# Patient Record
Sex: Female | Born: 1965 | Race: White | Hispanic: No | Marital: Married | Smoking: Never smoker
Health system: Southern US, Community
[De-identification: ages and names within clinical notes are randomized; demographics above are authoritative.]

## PROBLEM LIST (undated history)

## (undated) DIAGNOSIS — E119 Type 2 diabetes mellitus without complications: Secondary | ICD-10-CM

## (undated) DIAGNOSIS — R7303 Prediabetes: Secondary | ICD-10-CM

## (undated) DIAGNOSIS — N6310 Unspecified lump in the right breast, unspecified quadrant: Secondary | ICD-10-CM

## (undated) DIAGNOSIS — F32A Depression, unspecified: Secondary | ICD-10-CM

## (undated) DIAGNOSIS — M722 Plantar fascial fibromatosis: Secondary | ICD-10-CM

## (undated) DIAGNOSIS — M7532 Calcific tendinitis of left shoulder: Secondary | ICD-10-CM

## (undated) DIAGNOSIS — F419 Anxiety disorder, unspecified: Secondary | ICD-10-CM

## (undated) DIAGNOSIS — E785 Hyperlipidemia, unspecified: Secondary | ICD-10-CM

## (undated) DIAGNOSIS — K219 Gastro-esophageal reflux disease without esophagitis: Secondary | ICD-10-CM

## (undated) DIAGNOSIS — K589 Irritable bowel syndrome without diarrhea: Secondary | ICD-10-CM

## (undated) DIAGNOSIS — M7552 Bursitis of left shoulder: Secondary | ICD-10-CM

## (undated) DIAGNOSIS — N63 Unspecified lump in unspecified breast: Secondary | ICD-10-CM

## (undated) DIAGNOSIS — E559 Vitamin D deficiency, unspecified: Secondary | ICD-10-CM

## (undated) DIAGNOSIS — I10 Essential (primary) hypertension: Secondary | ICD-10-CM

## (undated) HISTORY — PX: TONSILLECTOMY: SUR1361

## (undated) HISTORY — DX: Anxiety disorder, unspecified: F41.9

## (undated) HISTORY — DX: Plantar fascial fibromatosis: M72.2

## (undated) HISTORY — DX: Hyperlipidemia, unspecified: E78.5

## (undated) HISTORY — DX: Essential (primary) hypertension: I10

## (undated) HISTORY — PX: LAPAROSCOPY FOR ECTOPIC PREGNANCY: SUR765

## (undated) HISTORY — DX: Unspecified lump in unspecified breast: N63.0

## (undated) HISTORY — PX: COLONOSCOPY: SHX174

## (undated) HISTORY — DX: Irritable bowel syndrome, unspecified: K58.9

## (undated) HISTORY — DX: Gastro-esophageal reflux disease without esophagitis: K21.9

## (undated) HISTORY — DX: Depression, unspecified: F32.A

## (undated) HISTORY — DX: Calcific tendinitis of left shoulder: M75.32

## (undated) HISTORY — DX: Vitamin D deficiency, unspecified: E55.9

## (undated) HISTORY — DX: Bursitis of left shoulder: M75.52

---

## 2015-06-03 DIAGNOSIS — M7751 Other enthesopathy of right foot: Secondary | ICD-10-CM | POA: Diagnosis not present

## 2015-06-03 DIAGNOSIS — M722 Plantar fascial fibromatosis: Secondary | ICD-10-CM | POA: Diagnosis not present

## 2015-07-30 DIAGNOSIS — L219 Seborrheic dermatitis, unspecified: Secondary | ICD-10-CM | POA: Diagnosis not present

## 2015-07-30 DIAGNOSIS — L820 Inflamed seborrheic keratosis: Secondary | ICD-10-CM | POA: Diagnosis not present

## 2015-07-30 DIAGNOSIS — L821 Other seborrheic keratosis: Secondary | ICD-10-CM | POA: Diagnosis not present

## 2015-07-30 DIAGNOSIS — D485 Neoplasm of uncertain behavior of skin: Secondary | ICD-10-CM | POA: Diagnosis not present

## 2015-10-23 DIAGNOSIS — E669 Obesity, unspecified: Secondary | ICD-10-CM | POA: Diagnosis not present

## 2015-10-23 DIAGNOSIS — I10 Essential (primary) hypertension: Secondary | ICD-10-CM | POA: Diagnosis not present

## 2015-10-23 DIAGNOSIS — E785 Hyperlipidemia, unspecified: Secondary | ICD-10-CM | POA: Diagnosis not present

## 2015-10-23 DIAGNOSIS — E559 Vitamin D deficiency, unspecified: Secondary | ICD-10-CM | POA: Diagnosis not present

## 2015-10-23 DIAGNOSIS — F329 Major depressive disorder, single episode, unspecified: Secondary | ICD-10-CM | POA: Diagnosis not present

## 2015-10-23 DIAGNOSIS — Z1389 Encounter for screening for other disorder: Secondary | ICD-10-CM | POA: Diagnosis not present

## 2015-10-23 DIAGNOSIS — R7301 Impaired fasting glucose: Secondary | ICD-10-CM | POA: Diagnosis not present

## 2015-10-24 DIAGNOSIS — N23 Unspecified renal colic: Secondary | ICD-10-CM | POA: Diagnosis not present

## 2015-10-24 DIAGNOSIS — K219 Gastro-esophageal reflux disease without esophagitis: Secondary | ICD-10-CM | POA: Diagnosis not present

## 2015-10-24 DIAGNOSIS — M549 Dorsalgia, unspecified: Secondary | ICD-10-CM | POA: Diagnosis not present

## 2015-10-24 DIAGNOSIS — E785 Hyperlipidemia, unspecified: Secondary | ICD-10-CM | POA: Diagnosis not present

## 2015-10-24 DIAGNOSIS — I10 Essential (primary) hypertension: Secondary | ICD-10-CM | POA: Diagnosis not present

## 2015-10-24 DIAGNOSIS — R110 Nausea: Secondary | ICD-10-CM | POA: Diagnosis not present

## 2015-10-24 DIAGNOSIS — Z79899 Other long term (current) drug therapy: Secondary | ICD-10-CM | POA: Diagnosis not present

## 2015-10-25 DIAGNOSIS — M549 Dorsalgia, unspecified: Secondary | ICD-10-CM | POA: Diagnosis not present

## 2015-11-12 DIAGNOSIS — Z23 Encounter for immunization: Secondary | ICD-10-CM | POA: Diagnosis not present

## 2015-11-20 DIAGNOSIS — Z1231 Encounter for screening mammogram for malignant neoplasm of breast: Secondary | ICD-10-CM | POA: Diagnosis not present

## 2015-11-24 DIAGNOSIS — H524 Presbyopia: Secondary | ICD-10-CM | POA: Diagnosis not present

## 2015-11-24 DIAGNOSIS — H35362 Drusen (degenerative) of macula, left eye: Secondary | ICD-10-CM | POA: Diagnosis not present

## 2015-12-11 DIAGNOSIS — H1031 Unspecified acute conjunctivitis, right eye: Secondary | ICD-10-CM | POA: Diagnosis not present

## 2016-01-21 DIAGNOSIS — M75102 Unspecified rotator cuff tear or rupture of left shoulder, not specified as traumatic: Secondary | ICD-10-CM | POA: Diagnosis not present

## 2016-01-21 DIAGNOSIS — Z6839 Body mass index (BMI) 39.0-39.9, adult: Secondary | ICD-10-CM | POA: Diagnosis not present

## 2016-01-23 DIAGNOSIS — M7532 Calcific tendinitis of left shoulder: Secondary | ICD-10-CM | POA: Diagnosis not present

## 2016-01-23 DIAGNOSIS — M25512 Pain in left shoulder: Secondary | ICD-10-CM | POA: Diagnosis not present

## 2016-02-10 DIAGNOSIS — M25512 Pain in left shoulder: Secondary | ICD-10-CM | POA: Diagnosis not present

## 2016-02-16 DIAGNOSIS — M7532 Calcific tendinitis of left shoulder: Secondary | ICD-10-CM | POA: Diagnosis not present

## 2016-02-16 DIAGNOSIS — M7552 Bursitis of left shoulder: Secondary | ICD-10-CM | POA: Diagnosis not present

## 2016-02-16 DIAGNOSIS — M25512 Pain in left shoulder: Secondary | ICD-10-CM | POA: Diagnosis not present

## 2016-02-21 DIAGNOSIS — J019 Acute sinusitis, unspecified: Secondary | ICD-10-CM | POA: Diagnosis not present

## 2016-03-05 DIAGNOSIS — M25512 Pain in left shoulder: Secondary | ICD-10-CM | POA: Diagnosis not present

## 2016-03-08 DIAGNOSIS — M25512 Pain in left shoulder: Secondary | ICD-10-CM | POA: Diagnosis not present

## 2016-03-10 DIAGNOSIS — M25512 Pain in left shoulder: Secondary | ICD-10-CM | POA: Diagnosis not present

## 2016-03-15 DIAGNOSIS — Z6839 Body mass index (BMI) 39.0-39.9, adult: Secondary | ICD-10-CM | POA: Diagnosis not present

## 2016-03-15 DIAGNOSIS — R112 Nausea with vomiting, unspecified: Secondary | ICD-10-CM | POA: Diagnosis not present

## 2016-03-15 DIAGNOSIS — R6889 Other general symptoms and signs: Secondary | ICD-10-CM | POA: Diagnosis not present

## 2016-03-15 DIAGNOSIS — J208 Acute bronchitis due to other specified organisms: Secondary | ICD-10-CM | POA: Diagnosis not present

## 2016-04-26 DIAGNOSIS — R7301 Impaired fasting glucose: Secondary | ICD-10-CM | POA: Diagnosis not present

## 2016-04-26 DIAGNOSIS — R05 Cough: Secondary | ICD-10-CM | POA: Diagnosis not present

## 2016-04-26 DIAGNOSIS — E785 Hyperlipidemia, unspecified: Secondary | ICD-10-CM | POA: Diagnosis not present

## 2016-04-26 DIAGNOSIS — F329 Major depressive disorder, single episode, unspecified: Secondary | ICD-10-CM | POA: Diagnosis not present

## 2016-04-26 DIAGNOSIS — I10 Essential (primary) hypertension: Secondary | ICD-10-CM | POA: Diagnosis not present

## 2016-04-26 DIAGNOSIS — R509 Fever, unspecified: Secondary | ICD-10-CM | POA: Diagnosis not present

## 2016-04-26 DIAGNOSIS — E559 Vitamin D deficiency, unspecified: Secondary | ICD-10-CM | POA: Diagnosis not present

## 2016-05-11 DIAGNOSIS — J189 Pneumonia, unspecified organism: Secondary | ICD-10-CM | POA: Diagnosis not present

## 2016-11-01 DIAGNOSIS — I10 Essential (primary) hypertension: Secondary | ICD-10-CM | POA: Diagnosis not present

## 2016-11-01 DIAGNOSIS — R635 Abnormal weight gain: Secondary | ICD-10-CM | POA: Diagnosis not present

## 2016-11-01 DIAGNOSIS — E785 Hyperlipidemia, unspecified: Secondary | ICD-10-CM | POA: Diagnosis not present

## 2016-11-01 DIAGNOSIS — Z1389 Encounter for screening for other disorder: Secondary | ICD-10-CM | POA: Diagnosis not present

## 2016-11-01 DIAGNOSIS — N951 Menopausal and female climacteric states: Secondary | ICD-10-CM | POA: Diagnosis not present

## 2016-11-01 DIAGNOSIS — E559 Vitamin D deficiency, unspecified: Secondary | ICD-10-CM | POA: Diagnosis not present

## 2016-11-01 DIAGNOSIS — R7301 Impaired fasting glucose: Secondary | ICD-10-CM | POA: Diagnosis not present

## 2016-11-01 DIAGNOSIS — Z6839 Body mass index (BMI) 39.0-39.9, adult: Secondary | ICD-10-CM | POA: Diagnosis not present

## 2016-11-01 DIAGNOSIS — F329 Major depressive disorder, single episode, unspecified: Secondary | ICD-10-CM | POA: Diagnosis not present

## 2016-11-10 DIAGNOSIS — Z23 Encounter for immunization: Secondary | ICD-10-CM | POA: Diagnosis not present

## 2016-12-06 DIAGNOSIS — N951 Menopausal and female climacteric states: Secondary | ICD-10-CM | POA: Diagnosis not present

## 2016-12-20 DIAGNOSIS — Z1231 Encounter for screening mammogram for malignant neoplasm of breast: Secondary | ICD-10-CM | POA: Diagnosis not present

## 2017-05-02 DIAGNOSIS — Z6836 Body mass index (BMI) 36.0-36.9, adult: Secondary | ICD-10-CM | POA: Diagnosis not present

## 2017-05-02 DIAGNOSIS — I10 Essential (primary) hypertension: Secondary | ICD-10-CM | POA: Diagnosis not present

## 2017-05-02 DIAGNOSIS — E559 Vitamin D deficiency, unspecified: Secondary | ICD-10-CM | POA: Diagnosis not present

## 2017-05-02 DIAGNOSIS — E785 Hyperlipidemia, unspecified: Secondary | ICD-10-CM | POA: Diagnosis not present

## 2017-05-02 DIAGNOSIS — R7301 Impaired fasting glucose: Secondary | ICD-10-CM | POA: Diagnosis not present

## 2017-05-02 DIAGNOSIS — F329 Major depressive disorder, single episode, unspecified: Secondary | ICD-10-CM | POA: Diagnosis not present

## 2017-05-02 DIAGNOSIS — R002 Palpitations: Secondary | ICD-10-CM | POA: Diagnosis not present

## 2017-05-04 DIAGNOSIS — H35363 Drusen (degenerative) of macula, bilateral: Secondary | ICD-10-CM | POA: Diagnosis not present

## 2017-05-04 DIAGNOSIS — H524 Presbyopia: Secondary | ICD-10-CM | POA: Diagnosis not present

## 2017-10-31 DIAGNOSIS — I10 Essential (primary) hypertension: Secondary | ICD-10-CM | POA: Diagnosis not present

## 2017-10-31 DIAGNOSIS — F329 Major depressive disorder, single episode, unspecified: Secondary | ICD-10-CM | POA: Diagnosis not present

## 2017-10-31 DIAGNOSIS — R7301 Impaired fasting glucose: Secondary | ICD-10-CM | POA: Diagnosis not present

## 2017-10-31 DIAGNOSIS — E559 Vitamin D deficiency, unspecified: Secondary | ICD-10-CM | POA: Diagnosis not present

## 2017-10-31 DIAGNOSIS — E785 Hyperlipidemia, unspecified: Secondary | ICD-10-CM | POA: Diagnosis not present

## 2018-01-23 DIAGNOSIS — Z1231 Encounter for screening mammogram for malignant neoplasm of breast: Secondary | ICD-10-CM | POA: Diagnosis not present

## 2018-01-30 DIAGNOSIS — R7301 Impaired fasting glucose: Secondary | ICD-10-CM | POA: Diagnosis not present

## 2018-01-30 DIAGNOSIS — E785 Hyperlipidemia, unspecified: Secondary | ICD-10-CM | POA: Diagnosis not present

## 2018-02-05 DIAGNOSIS — J069 Acute upper respiratory infection, unspecified: Secondary | ICD-10-CM | POA: Diagnosis not present

## 2018-02-08 HISTORY — PX: BREAST BIOPSY: SHX20

## 2018-02-27 ENCOUNTER — Other Ambulatory Visit: Payer: Self-pay | Admitting: Internal Medicine

## 2018-02-27 DIAGNOSIS — R928 Other abnormal and inconclusive findings on diagnostic imaging of breast: Secondary | ICD-10-CM | POA: Diagnosis not present

## 2018-02-27 DIAGNOSIS — N6342 Unspecified lump in left breast, subareolar: Secondary | ICD-10-CM | POA: Diagnosis not present

## 2018-02-27 DIAGNOSIS — N6320 Unspecified lump in the left breast, unspecified quadrant: Secondary | ICD-10-CM

## 2018-02-27 DIAGNOSIS — N6489 Other specified disorders of breast: Secondary | ICD-10-CM | POA: Diagnosis not present

## 2018-02-27 DIAGNOSIS — N6321 Unspecified lump in the left breast, upper outer quadrant: Secondary | ICD-10-CM | POA: Diagnosis not present

## 2018-03-06 ENCOUNTER — Ambulatory Visit
Admission: RE | Admit: 2018-03-06 | Discharge: 2018-03-06 | Disposition: A | Discharge status: Home / Self Care | Payer: BLUE CROSS/BLUE SHIELD | Source: Ambulatory Visit | Attending: Internal Medicine | Admitting: Internal Medicine

## 2018-03-06 DIAGNOSIS — N6320 Unspecified lump in the left breast, unspecified quadrant: Secondary | ICD-10-CM

## 2018-03-06 DIAGNOSIS — N6321 Unspecified lump in the left breast, upper outer quadrant: Secondary | ICD-10-CM | POA: Diagnosis not present

## 2018-03-06 DIAGNOSIS — D242 Benign neoplasm of left breast: Secondary | ICD-10-CM | POA: Diagnosis not present

## 2018-03-06 DIAGNOSIS — N6489 Other specified disorders of breast: Secondary | ICD-10-CM | POA: Diagnosis not present

## 2018-03-06 DIAGNOSIS — N6323 Unspecified lump in the left breast, lower outer quadrant: Secondary | ICD-10-CM | POA: Diagnosis not present

## 2018-05-01 DIAGNOSIS — E785 Hyperlipidemia, unspecified: Secondary | ICD-10-CM | POA: Diagnosis not present

## 2018-05-01 DIAGNOSIS — F329 Major depressive disorder, single episode, unspecified: Secondary | ICD-10-CM | POA: Diagnosis not present

## 2018-05-01 DIAGNOSIS — E559 Vitamin D deficiency, unspecified: Secondary | ICD-10-CM | POA: Diagnosis not present

## 2018-05-01 DIAGNOSIS — I10 Essential (primary) hypertension: Secondary | ICD-10-CM | POA: Diagnosis not present

## 2018-05-01 DIAGNOSIS — R7301 Impaired fasting glucose: Secondary | ICD-10-CM | POA: Diagnosis not present

## 2018-05-26 DIAGNOSIS — H6011 Cellulitis of right external ear: Secondary | ICD-10-CM | POA: Diagnosis not present

## 2018-07-03 DIAGNOSIS — S30861A Insect bite (nonvenomous) of abdominal wall, initial encounter: Secondary | ICD-10-CM | POA: Diagnosis not present

## 2018-07-17 DIAGNOSIS — A770 Spotted fever due to Rickettsia rickettsii: Secondary | ICD-10-CM | POA: Diagnosis not present

## 2018-08-01 DIAGNOSIS — Z79890 Hormone replacement therapy (postmenopausal): Secondary | ICD-10-CM | POA: Diagnosis not present

## 2018-08-01 DIAGNOSIS — Z01419 Encounter for gynecological examination (general) (routine) without abnormal findings: Secondary | ICD-10-CM | POA: Diagnosis not present

## 2018-08-01 DIAGNOSIS — H524 Presbyopia: Secondary | ICD-10-CM | POA: Diagnosis not present

## 2018-08-01 DIAGNOSIS — Z124 Encounter for screening for malignant neoplasm of cervix: Secondary | ICD-10-CM | POA: Diagnosis not present

## 2018-08-01 DIAGNOSIS — H353131 Nonexudative age-related macular degeneration, bilateral, early dry stage: Secondary | ICD-10-CM | POA: Diagnosis not present

## 2018-08-02 DIAGNOSIS — B354 Tinea corporis: Secondary | ICD-10-CM | POA: Diagnosis not present

## 2018-11-06 DIAGNOSIS — I10 Essential (primary) hypertension: Secondary | ICD-10-CM | POA: Diagnosis not present

## 2018-11-06 DIAGNOSIS — L4050 Arthropathic psoriasis, unspecified: Secondary | ICD-10-CM | POA: Diagnosis not present

## 2018-11-06 DIAGNOSIS — F329 Major depressive disorder, single episode, unspecified: Secondary | ICD-10-CM | POA: Diagnosis not present

## 2018-11-06 DIAGNOSIS — R7301 Impaired fasting glucose: Secondary | ICD-10-CM | POA: Diagnosis not present

## 2018-11-06 DIAGNOSIS — Z23 Encounter for immunization: Secondary | ICD-10-CM | POA: Diagnosis not present

## 2018-11-06 DIAGNOSIS — E559 Vitamin D deficiency, unspecified: Secondary | ICD-10-CM | POA: Diagnosis not present

## 2018-11-06 DIAGNOSIS — E785 Hyperlipidemia, unspecified: Secondary | ICD-10-CM | POA: Diagnosis not present

## 2018-11-07 ENCOUNTER — Other Ambulatory Visit: Payer: Self-pay | Admitting: Internal Medicine

## 2018-11-07 DIAGNOSIS — Z1231 Encounter for screening mammogram for malignant neoplasm of breast: Secondary | ICD-10-CM

## 2018-12-26 ENCOUNTER — Ambulatory Visit
Admission: RE | Admit: 2018-12-26 | Discharge: 2018-12-26 | Disposition: A | Discharge status: Home / Self Care | Payer: BC Managed Care – PPO | Source: Ambulatory Visit | Attending: Internal Medicine | Admitting: Internal Medicine

## 2018-12-26 ENCOUNTER — Other Ambulatory Visit: Payer: Self-pay

## 2018-12-26 DIAGNOSIS — Z1231 Encounter for screening mammogram for malignant neoplasm of breast: Secondary | ICD-10-CM

## 2019-03-26 DIAGNOSIS — M79601 Pain in right arm: Secondary | ICD-10-CM | POA: Diagnosis not present

## 2019-05-07 DIAGNOSIS — F329 Major depressive disorder, single episode, unspecified: Secondary | ICD-10-CM | POA: Diagnosis not present

## 2019-05-07 DIAGNOSIS — E785 Hyperlipidemia, unspecified: Secondary | ICD-10-CM | POA: Diagnosis not present

## 2019-05-07 DIAGNOSIS — R7301 Impaired fasting glucose: Secondary | ICD-10-CM | POA: Diagnosis not present

## 2019-05-07 DIAGNOSIS — E559 Vitamin D deficiency, unspecified: Secondary | ICD-10-CM | POA: Diagnosis not present

## 2019-05-07 DIAGNOSIS — I10 Essential (primary) hypertension: Secondary | ICD-10-CM | POA: Diagnosis not present

## 2019-05-16 DIAGNOSIS — M7531 Calcific tendinitis of right shoulder: Secondary | ICD-10-CM | POA: Diagnosis not present

## 2019-05-16 DIAGNOSIS — M25511 Pain in right shoulder: Secondary | ICD-10-CM | POA: Diagnosis not present

## 2019-05-31 DIAGNOSIS — M25611 Stiffness of right shoulder, not elsewhere classified: Secondary | ICD-10-CM | POA: Diagnosis not present

## 2019-05-31 DIAGNOSIS — M25511 Pain in right shoulder: Secondary | ICD-10-CM | POA: Diagnosis not present

## 2019-05-31 DIAGNOSIS — M6281 Muscle weakness (generalized): Secondary | ICD-10-CM | POA: Diagnosis not present

## 2019-06-06 DIAGNOSIS — M6281 Muscle weakness (generalized): Secondary | ICD-10-CM | POA: Diagnosis not present

## 2019-06-06 DIAGNOSIS — M25511 Pain in right shoulder: Secondary | ICD-10-CM | POA: Diagnosis not present

## 2019-06-06 DIAGNOSIS — M25611 Stiffness of right shoulder, not elsewhere classified: Secondary | ICD-10-CM | POA: Diagnosis not present

## 2019-06-14 DIAGNOSIS — M25611 Stiffness of right shoulder, not elsewhere classified: Secondary | ICD-10-CM | POA: Diagnosis not present

## 2019-06-14 DIAGNOSIS — M6281 Muscle weakness (generalized): Secondary | ICD-10-CM | POA: Diagnosis not present

## 2019-06-14 DIAGNOSIS — M25511 Pain in right shoulder: Secondary | ICD-10-CM | POA: Diagnosis not present

## 2019-06-27 DIAGNOSIS — M25511 Pain in right shoulder: Secondary | ICD-10-CM | POA: Diagnosis not present

## 2019-07-06 DIAGNOSIS — M6281 Muscle weakness (generalized): Secondary | ICD-10-CM | POA: Diagnosis not present

## 2019-07-06 DIAGNOSIS — M25611 Stiffness of right shoulder, not elsewhere classified: Secondary | ICD-10-CM | POA: Diagnosis not present

## 2019-07-06 DIAGNOSIS — M25511 Pain in right shoulder: Secondary | ICD-10-CM | POA: Diagnosis not present

## 2019-07-09 DIAGNOSIS — K5900 Constipation, unspecified: Secondary | ICD-10-CM | POA: Diagnosis not present

## 2019-07-09 DIAGNOSIS — K645 Perianal venous thrombosis: Secondary | ICD-10-CM | POA: Diagnosis not present

## 2019-07-10 DIAGNOSIS — K645 Perianal venous thrombosis: Secondary | ICD-10-CM | POA: Diagnosis not present

## 2019-07-12 DIAGNOSIS — K645 Perianal venous thrombosis: Secondary | ICD-10-CM | POA: Diagnosis not present

## 2019-08-06 DIAGNOSIS — M25611 Stiffness of right shoulder, not elsewhere classified: Secondary | ICD-10-CM | POA: Diagnosis not present

## 2019-08-06 DIAGNOSIS — M6281 Muscle weakness (generalized): Secondary | ICD-10-CM | POA: Diagnosis not present

## 2019-08-06 DIAGNOSIS — M25511 Pain in right shoulder: Secondary | ICD-10-CM | POA: Diagnosis not present

## 2019-08-22 DIAGNOSIS — J019 Acute sinusitis, unspecified: Secondary | ICD-10-CM | POA: Diagnosis not present

## 2019-08-22 DIAGNOSIS — H6692 Otitis media, unspecified, left ear: Secondary | ICD-10-CM | POA: Diagnosis not present

## 2019-08-22 DIAGNOSIS — B9689 Other specified bacterial agents as the cause of diseases classified elsewhere: Secondary | ICD-10-CM | POA: Diagnosis not present

## 2019-08-22 DIAGNOSIS — F419 Anxiety disorder, unspecified: Secondary | ICD-10-CM | POA: Diagnosis not present

## 2019-09-03 DIAGNOSIS — Z01419 Encounter for gynecological examination (general) (routine) without abnormal findings: Secondary | ICD-10-CM | POA: Diagnosis not present

## 2019-09-03 DIAGNOSIS — Z79890 Hormone replacement therapy (postmenopausal): Secondary | ICD-10-CM | POA: Diagnosis not present

## 2019-09-03 DIAGNOSIS — Z1231 Encounter for screening mammogram for malignant neoplasm of breast: Secondary | ICD-10-CM | POA: Diagnosis not present

## 2019-09-05 DIAGNOSIS — M25511 Pain in right shoulder: Secondary | ICD-10-CM | POA: Diagnosis not present

## 2019-09-05 DIAGNOSIS — M6281 Muscle weakness (generalized): Secondary | ICD-10-CM | POA: Diagnosis not present

## 2019-09-05 DIAGNOSIS — M25611 Stiffness of right shoulder, not elsewhere classified: Secondary | ICD-10-CM | POA: Diagnosis not present

## 2019-10-06 DIAGNOSIS — M25611 Stiffness of right shoulder, not elsewhere classified: Secondary | ICD-10-CM | POA: Diagnosis not present

## 2019-10-06 DIAGNOSIS — M6281 Muscle weakness (generalized): Secondary | ICD-10-CM | POA: Diagnosis not present

## 2019-10-06 DIAGNOSIS — M25511 Pain in right shoulder: Secondary | ICD-10-CM | POA: Diagnosis not present

## 2019-11-05 DIAGNOSIS — M6281 Muscle weakness (generalized): Secondary | ICD-10-CM | POA: Diagnosis not present

## 2019-11-05 DIAGNOSIS — M25511 Pain in right shoulder: Secondary | ICD-10-CM | POA: Diagnosis not present

## 2019-11-05 DIAGNOSIS — M25611 Stiffness of right shoulder, not elsewhere classified: Secondary | ICD-10-CM | POA: Diagnosis not present

## 2019-11-06 DIAGNOSIS — R7301 Impaired fasting glucose: Secondary | ICD-10-CM | POA: Diagnosis not present

## 2019-11-06 DIAGNOSIS — I10 Essential (primary) hypertension: Secondary | ICD-10-CM | POA: Diagnosis not present

## 2019-11-06 DIAGNOSIS — F329 Major depressive disorder, single episode, unspecified: Secondary | ICD-10-CM | POA: Diagnosis not present

## 2019-11-06 DIAGNOSIS — E785 Hyperlipidemia, unspecified: Secondary | ICD-10-CM | POA: Diagnosis not present

## 2019-11-06 DIAGNOSIS — E559 Vitamin D deficiency, unspecified: Secondary | ICD-10-CM | POA: Diagnosis not present

## 2020-01-26 DIAGNOSIS — R509 Fever, unspecified: Secondary | ICD-10-CM | POA: Diagnosis not present

## 2020-01-26 DIAGNOSIS — Z20822 Contact with and (suspected) exposure to covid-19: Secondary | ICD-10-CM | POA: Diagnosis not present

## 2020-01-29 DIAGNOSIS — B9689 Other specified bacterial agents as the cause of diseases classified elsewhere: Secondary | ICD-10-CM | POA: Diagnosis not present

## 2020-01-29 DIAGNOSIS — J019 Acute sinusitis, unspecified: Secondary | ICD-10-CM | POA: Diagnosis not present

## 2020-03-05 ENCOUNTER — Other Ambulatory Visit: Payer: Self-pay | Admitting: Internal Medicine

## 2020-03-05 DIAGNOSIS — Z1231 Encounter for screening mammogram for malignant neoplasm of breast: Secondary | ICD-10-CM

## 2020-03-21 DIAGNOSIS — M7711 Lateral epicondylitis, right elbow: Secondary | ICD-10-CM | POA: Diagnosis not present

## 2020-04-18 ENCOUNTER — Ambulatory Visit: Payer: BC Managed Care – PPO

## 2020-05-05 ENCOUNTER — Ambulatory Visit: Payer: Self-pay

## 2020-05-06 DIAGNOSIS — E559 Vitamin D deficiency, unspecified: Secondary | ICD-10-CM | POA: Diagnosis not present

## 2020-05-06 DIAGNOSIS — R7301 Impaired fasting glucose: Secondary | ICD-10-CM | POA: Diagnosis not present

## 2020-05-06 DIAGNOSIS — I10 Essential (primary) hypertension: Secondary | ICD-10-CM | POA: Diagnosis not present

## 2020-05-06 DIAGNOSIS — E785 Hyperlipidemia, unspecified: Secondary | ICD-10-CM | POA: Diagnosis not present

## 2020-05-06 DIAGNOSIS — F330 Major depressive disorder, recurrent, mild: Secondary | ICD-10-CM | POA: Diagnosis not present

## 2020-05-12 DIAGNOSIS — K5140 Inflammatory polyps of colon without complications: Secondary | ICD-10-CM | POA: Diagnosis not present

## 2020-05-12 DIAGNOSIS — K635 Polyp of colon: Secondary | ICD-10-CM | POA: Diagnosis not present

## 2020-05-12 DIAGNOSIS — K529 Noninfective gastroenteritis and colitis, unspecified: Secondary | ICD-10-CM | POA: Diagnosis not present

## 2020-05-12 DIAGNOSIS — Z1211 Encounter for screening for malignant neoplasm of colon: Secondary | ICD-10-CM | POA: Diagnosis not present

## 2020-05-12 DIAGNOSIS — D125 Benign neoplasm of sigmoid colon: Secondary | ICD-10-CM | POA: Diagnosis not present

## 2020-05-22 ENCOUNTER — Other Ambulatory Visit: Payer: Self-pay

## 2020-05-22 ENCOUNTER — Ambulatory Visit
Admission: RE | Admit: 2020-05-22 | Discharge: 2020-05-22 | Disposition: A | Discharge status: Home / Self Care | Payer: BC Managed Care – PPO | Source: Ambulatory Visit | Attending: Internal Medicine | Admitting: Internal Medicine

## 2020-05-22 DIAGNOSIS — Z1231 Encounter for screening mammogram for malignant neoplasm of breast: Secondary | ICD-10-CM | POA: Diagnosis not present

## 2020-05-26 ENCOUNTER — Other Ambulatory Visit: Payer: Self-pay | Admitting: Internal Medicine

## 2020-05-26 DIAGNOSIS — R928 Other abnormal and inconclusive findings on diagnostic imaging of breast: Secondary | ICD-10-CM

## 2020-06-19 ENCOUNTER — Other Ambulatory Visit: Payer: Self-pay

## 2020-06-19 ENCOUNTER — Other Ambulatory Visit: Payer: Self-pay | Admitting: Internal Medicine

## 2020-06-19 ENCOUNTER — Ambulatory Visit
Admission: RE | Admit: 2020-06-19 | Discharge: 2020-06-19 | Disposition: A | Discharge status: Home / Self Care | Payer: BC Managed Care – PPO | Source: Ambulatory Visit | Attending: Internal Medicine | Admitting: Internal Medicine

## 2020-06-19 DIAGNOSIS — N6311 Unspecified lump in the right breast, upper outer quadrant: Secondary | ICD-10-CM | POA: Diagnosis not present

## 2020-06-19 DIAGNOSIS — R928 Other abnormal and inconclusive findings on diagnostic imaging of breast: Secondary | ICD-10-CM

## 2020-06-19 DIAGNOSIS — N6312 Unspecified lump in the right breast, upper inner quadrant: Secondary | ICD-10-CM | POA: Diagnosis not present

## 2020-06-24 ENCOUNTER — Ambulatory Visit
Admission: RE | Admit: 2020-06-24 | Discharge: 2020-06-24 | Disposition: A | Discharge status: Home / Self Care | Payer: BC Managed Care – PPO | Source: Ambulatory Visit | Attending: Internal Medicine | Admitting: Internal Medicine

## 2020-06-24 ENCOUNTER — Other Ambulatory Visit: Payer: Self-pay

## 2020-06-24 DIAGNOSIS — N649 Disorder of breast, unspecified: Secondary | ICD-10-CM | POA: Diagnosis not present

## 2020-06-24 DIAGNOSIS — R928 Other abnormal and inconclusive findings on diagnostic imaging of breast: Secondary | ICD-10-CM

## 2020-06-24 DIAGNOSIS — N6315 Unspecified lump in the right breast, overlapping quadrants: Secondary | ICD-10-CM | POA: Diagnosis not present

## 2020-07-09 DIAGNOSIS — U071 COVID-19: Secondary | ICD-10-CM

## 2020-07-09 HISTORY — DX: COVID-19: U07.1

## 2020-08-06 DIAGNOSIS — U099 Post covid-19 condition, unspecified: Secondary | ICD-10-CM | POA: Diagnosis not present

## 2020-08-06 DIAGNOSIS — R053 Chronic cough: Secondary | ICD-10-CM | POA: Diagnosis not present

## 2020-08-06 DIAGNOSIS — E559 Vitamin D deficiency, unspecified: Secondary | ICD-10-CM | POA: Diagnosis not present

## 2020-08-06 DIAGNOSIS — I10 Essential (primary) hypertension: Secondary | ICD-10-CM | POA: Diagnosis not present

## 2020-08-06 DIAGNOSIS — R7301 Impaired fasting glucose: Secondary | ICD-10-CM | POA: Diagnosis not present

## 2020-08-06 DIAGNOSIS — F330 Major depressive disorder, recurrent, mild: Secondary | ICD-10-CM | POA: Diagnosis not present

## 2020-08-06 DIAGNOSIS — E785 Hyperlipidemia, unspecified: Secondary | ICD-10-CM | POA: Diagnosis not present

## 2020-09-05 DIAGNOSIS — N6341 Unspecified lump in right breast, subareolar: Secondary | ICD-10-CM | POA: Diagnosis not present

## 2020-09-10 ENCOUNTER — Other Ambulatory Visit: Payer: Self-pay | Admitting: General Surgery

## 2020-09-10 DIAGNOSIS — N6310 Unspecified lump in the right breast, unspecified quadrant: Secondary | ICD-10-CM

## 2020-09-16 ENCOUNTER — Ambulatory Visit (INDEPENDENT_AMBULATORY_CARE_PROVIDER_SITE_OTHER): Payer: BC Managed Care – PPO | Admitting: Pulmonary Disease

## 2020-09-16 ENCOUNTER — Encounter: Payer: Self-pay | Admitting: Pulmonary Disease

## 2020-09-16 ENCOUNTER — Other Ambulatory Visit: Payer: Self-pay

## 2020-09-16 VITALS — BP 130/78 | HR 70 | Temp 98.8°F | Ht 65.0 in | Wt 224.4 lb

## 2020-09-16 DIAGNOSIS — D649 Anemia, unspecified: Secondary | ICD-10-CM | POA: Diagnosis not present

## 2020-09-16 DIAGNOSIS — R0683 Snoring: Secondary | ICD-10-CM | POA: Diagnosis not present

## 2020-09-16 LAB — COMPREHENSIVE METABOLIC PANEL
ALT: 17 U/L (ref 0–35)
AST: 16 U/L (ref 0–37)
Albumin: 4.2 g/dL (ref 3.5–5.2)
Alkaline Phosphatase: 51 U/L (ref 39–117)
BUN: 14 mg/dL (ref 6–23)
CO2: 30 mEq/L (ref 19–32)
Calcium: 9.4 mg/dL (ref 8.4–10.5)
Chloride: 102 mEq/L (ref 96–112)
Creatinine, Ser: 0.71 mg/dL (ref 0.40–1.20)
GFR: 97.22 mL/min (ref >60.00)
Glucose, Bld: 107 mg/dL — ABNORMAL HIGH (ref 70–99)
Potassium: 4.2 mEq/L (ref 3.5–5.1)
Sodium: 139 mEq/L (ref 135–145)
Total Bilirubin: 0.4 mg/dL (ref 0.2–1.2)
Total Protein: 7 g/dL (ref 6.0–8.3)

## 2020-09-16 LAB — FOLATE: Folate: >24.4 ng/mL (ref >5.9)

## 2020-09-16 LAB — VITAMIN B12: Vitamin B-12: 900 pg/mL (ref 211–911)

## 2020-09-16 LAB — FERRITIN: Ferritin: 200.8 ng/mL (ref 10.0–291.0)

## 2020-09-16 NOTE — Progress Notes (Signed)
[facility]  Chief Complaint  Patient presents with   Consult    Snoring and gasping at night. Had colonoscopy and was needing oxygen and gasping    Constitutional:  BP 130/78 (BP Location: Left Arm, Cuff Size: Normal)   Pulse 70   Temp 98.8 F (37.1 C) (Temporal)   Ht '5\' 5"'$  (1.651 m)   Wt 224 lb 6.4 oz (101.8 kg)   SpO2 98%   BMI 37.34 kg/m   Past Medical History:  Anxiety, GERD, HLD, HTN, Breast mass, COVID 27 July 2020, Vit D deficiency, Depression, IBS, Plantar fasciitis, Calcific tendinitis of Lt shoulder, Bursitis of Lt shoulder  Past Surgical History:  She  has a past surgical history that includes Breast biopsy (Left, 2020); Cesarean section; Laparoscopy for ectopic pregnancy; Tonsillectomy; and Colonoscopy.  Brief Summary:  Margaret Mills is a 55 y.o. female with snoring.  She works as a Emergency planning/management officer.      Subjective:   She has snored for years.  She is a restless sleeper.  She had colonoscopy recently and had low oxygen level.  She was advised by anesthesiologist to get sleep testing.  She has trouble staying awake watching TV.  She gets frequent leg symptoms at night.  She grinds her teeth.  She wakes up hearing herself snore, and has been told she stops breathing at night.  She goes to sleep at 930 pm.  She falls asleep in 10 minutes.  She wakes up some times to use the bathroom.  She gets out of bed at 445 am.  She feels tired in the morning.  She denies morning headache.  She takes xanax at 830 pm to help with anxiety and sleep.  She drinks 3 cups of coffee during the day.  She denies sleep walking, sleep talking, or nightmares.  She denies sleep hallucinations, sleep paralysis, or cataplexy.  The Epworth score is 8 out of 24.   Physical Exam:   Appearance - well kempt   ENMT - no sinus tenderness, no oral exudate, no LAN, Mallampati 2 airway, no stridor  Respiratory - equal breath sounds bilaterally, no  wheezing or rales  CV - s1s2 regular rate and rhythm, no murmurs  Ext - no clubbing, no edema  Skin - no rashes  Psych - normal mood and affect   Sleep Tests:    Social History:  She  reports that she has never smoked. She has never used smokeless tobacco. She reports current alcohol use. She reports that she does not use drugs.  Family History:  Her family history includes Breast cancer in her mother; Coronary artery disease in her father; Diabetes Mellitus II in her father; Hyperlipidemia in her father and mother; Hypertension in her father and mother; Skin cancer in her father.   Discussion:  She has snoring, sleep disruption, apnea, and daytime sleepiness.  She has noticed trouble with memory and concentration.  She reports symptoms of restless legs syndrome and bruxism.  She has history of hypertension and depression with anxiety.  Her BMI is > 35.  I am concerned she could have obstructive sleep apnea.  Assessment/Plan:   Snoring with excessive daytime sleepiness. - will need to arrange for a home sleep study  Restless leg syndrome. - will check CMET and anemia panel  Obesity. - discussed how weight can impact sleep and risk for sleep disordered breathing - discussed options to assist with weight loss: combination of diet modification, cardiovascular  and strength training exercises  Cardiovascular risk. - had an extensive discussion regarding the adverse health consequences related to untreated sleep disordered breathing - specifically discussed the risks for hypertension, coronary artery disease, cardiac dysrhythmias, cerebrovascular disease, and diabetes - lifestyle modification discussed  Safe driving practices. - discussed how sleep disruption can increase risk of accidents, particularly when driving - safe driving practices were discussed  Therapies for obstructive sleep apnea. - if the sleep study shows significant sleep apnea, then various therapies for  treatment were reviewed: CPAP, oral appliance, and surgical interventions  Time Spent Involved in Patient Care on Day of Examination:  34 minutes  Follow up:   Patient Instructions  Lab tests today Will arrange for home sleep study Will call to arrange for follow up after sleep study reviewed  Medication List:   Allergies as of 09/16/2020       Reactions   Nsaids    Stomach cramping   Percocet [oxycodone-acetaminophen]    itching        Medication List        Accurate as of September 16, 2020 10:52 AM. If you have any questions, ask your nurse or doctor.          ALPRAZolam 0.25 MG tablet Commonly known as: XANAX Take 0.25 mg by mouth at bedtime as needed for anxiety.   estrogen (conjugated)-medroxyprogesterone 0.3-1.5 MG tablet Commonly known as: PREMPRO Take 1 tablet by mouth daily.   Fish Oil 1000 MG Caps Take 1,000 mg by mouth. daily   lactobacillus acidophilus Tabs tablet Take 2 tablets by mouth. daily   losartan 25 MG tablet Commonly known as: COZAAR Take 25 mg by mouth daily.   metoprolol succinate 100 MG 24 hr tablet Commonly known as: TOPROL-XL Take 100 mg by mouth daily. Take with or immediately following a meal.   SUPER B COMPLEX/C PO Take by mouth.   valsartan 40 MG tablet Commonly known as: DIOVAN Take 40 mg by mouth daily.   venlafaxine 75 MG tablet Commonly known as: EFFEXOR Take 75 mg by mouth. daily        Signature:  Chesley Mires, MD [place] Pager - [phone] 09/16/2020, 10:52 AM

## 2020-09-16 NOTE — Patient Instructions (Signed)
Lab tests today  Will arrange for home sleep study  Will call to arrange for follow up after sleep study reviewed  

## 2020-09-17 LAB — IRON AND TIBC
Iron Saturation: 27 % (ref 15–55)
Iron: 80 ug/dL (ref 27–159)
Total Iron Binding Capacity: 295 ug/dL (ref 250–450)
UIBC: 215 ug/dL (ref 131–425)

## 2020-09-18 ENCOUNTER — Encounter: Payer: Self-pay | Admitting: *Deleted

## 2020-10-01 ENCOUNTER — Encounter (HOSPITAL_BASED_OUTPATIENT_CLINIC_OR_DEPARTMENT_OTHER)
Admission: RE | Admit: 2020-10-01 | Discharge: 2020-10-01 | Disposition: A | Discharge status: Home / Self Care | Payer: BC Managed Care – PPO | Source: Ambulatory Visit | Attending: General Surgery | Admitting: General Surgery

## 2020-10-01 ENCOUNTER — Other Ambulatory Visit: Payer: Self-pay

## 2020-10-01 ENCOUNTER — Encounter (HOSPITAL_BASED_OUTPATIENT_CLINIC_OR_DEPARTMENT_OTHER): Payer: Self-pay | Admitting: General Surgery

## 2020-10-01 DIAGNOSIS — Z01812 Encounter for preprocedural laboratory examination: Secondary | ICD-10-CM | POA: Insufficient documentation

## 2020-10-01 MED ORDER — CHLORHEXIDINE GLUCONATE CLOTH 2 % EX PADS: 6.0000 | MEDICATED_PAD | Freq: Once | CUTANEOUS | Status: DC

## 2020-10-01 MED ORDER — ENSURE PRE-SURGERY PO LIQD: 296.0000 mL | Freq: Once | ORAL | Status: DC

## 2020-10-01 NOTE — Progress Notes (Signed)
Dr. Sabra Heck reviewed pt's EKG and pt's history.  Ok for pt to have surgery here at [facility] on 10/08/2020.

## 2020-10-01 NOTE — Progress Notes (Signed)

## 2020-10-07 ENCOUNTER — Ambulatory Visit
Admission: RE | Admit: 2020-10-07 | Discharge: 2020-10-07 | Disposition: A | Discharge status: Home / Self Care | Payer: BC Managed Care – PPO | Source: Ambulatory Visit | Attending: General Surgery | Admitting: General Surgery

## 2020-10-07 ENCOUNTER — Other Ambulatory Visit: Payer: Self-pay

## 2020-10-07 DIAGNOSIS — R928 Other abnormal and inconclusive findings on diagnostic imaging of breast: Secondary | ICD-10-CM | POA: Diagnosis not present

## 2020-10-07 DIAGNOSIS — N6310 Unspecified lump in the right breast, unspecified quadrant: Secondary | ICD-10-CM

## 2020-10-08 ENCOUNTER — Encounter (HOSPITAL_BASED_OUTPATIENT_CLINIC_OR_DEPARTMENT_OTHER)
Admission: RE | Disposition: A | Discharge status: Home / Self Care | Payer: Self-pay | Source: Ambulatory Visit | Attending: General Surgery

## 2020-10-08 ENCOUNTER — Encounter (HOSPITAL_BASED_OUTPATIENT_CLINIC_OR_DEPARTMENT_OTHER): Payer: Self-pay | Admitting: General Surgery

## 2020-10-08 ENCOUNTER — Ambulatory Visit (HOSPITAL_BASED_OUTPATIENT_CLINIC_OR_DEPARTMENT_OTHER): Payer: BC Managed Care – PPO | Admitting: Anesthesiology

## 2020-10-08 ENCOUNTER — Ambulatory Visit (HOSPITAL_BASED_OUTPATIENT_CLINIC_OR_DEPARTMENT_OTHER)
Admission: RE | Admit: 2020-10-08 | Discharge: 2020-10-08 | Disposition: A | Discharge status: Home / Self Care | Payer: BC Managed Care – PPO | Source: Ambulatory Visit | Attending: General Surgery | Admitting: General Surgery

## 2020-10-08 ENCOUNTER — Other Ambulatory Visit: Payer: Self-pay

## 2020-10-08 ENCOUNTER — Ambulatory Visit
Admission: RE | Admit: 2020-10-08 | Discharge: 2020-10-08 | Disposition: A | Discharge status: Home / Self Care | Payer: BC Managed Care – PPO | Source: Ambulatory Visit | Attending: General Surgery | Admitting: General Surgery

## 2020-10-08 DIAGNOSIS — E785 Hyperlipidemia, unspecified: Secondary | ICD-10-CM | POA: Insufficient documentation

## 2020-10-08 DIAGNOSIS — G4730 Sleep apnea, unspecified: Secondary | ICD-10-CM | POA: Diagnosis not present

## 2020-10-08 DIAGNOSIS — Z98891 History of uterine scar from previous surgery: Secondary | ICD-10-CM | POA: Diagnosis not present

## 2020-10-08 DIAGNOSIS — Z8249 Family history of ischemic heart disease and other diseases of the circulatory system: Secondary | ICD-10-CM | POA: Insufficient documentation

## 2020-10-08 DIAGNOSIS — Z803 Family history of malignant neoplasm of breast: Secondary | ICD-10-CM | POA: Diagnosis not present

## 2020-10-08 DIAGNOSIS — Z886 Allergy status to analgesic agent status: Secondary | ICD-10-CM | POA: Diagnosis not present

## 2020-10-08 DIAGNOSIS — Z808 Family history of malignant neoplasm of other organs or systems: Secondary | ICD-10-CM | POA: Insufficient documentation

## 2020-10-08 DIAGNOSIS — D241 Benign neoplasm of right breast: Secondary | ICD-10-CM | POA: Diagnosis not present

## 2020-10-08 DIAGNOSIS — R928 Other abnormal and inconclusive findings on diagnostic imaging of breast: Secondary | ICD-10-CM | POA: Diagnosis not present

## 2020-10-08 DIAGNOSIS — Z885 Allergy status to narcotic agent status: Secondary | ICD-10-CM | POA: Diagnosis not present

## 2020-10-08 DIAGNOSIS — N6310 Unspecified lump in the right breast, unspecified quadrant: Secondary | ICD-10-CM

## 2020-10-08 DIAGNOSIS — L905 Scar conditions and fibrosis of skin: Secondary | ICD-10-CM | POA: Diagnosis not present

## 2020-10-08 DIAGNOSIS — Z8759 Personal history of other complications of pregnancy, childbirth and the puerperium: Secondary | ICD-10-CM | POA: Diagnosis not present

## 2020-10-08 DIAGNOSIS — Z79899 Other long term (current) drug therapy: Secondary | ICD-10-CM | POA: Insufficient documentation

## 2020-10-08 DIAGNOSIS — I10 Essential (primary) hypertension: Secondary | ICD-10-CM | POA: Insufficient documentation

## 2020-10-08 DIAGNOSIS — F418 Other specified anxiety disorders: Secondary | ICD-10-CM | POA: Diagnosis not present

## 2020-10-08 HISTORY — DX: Unspecified lump in the right breast, unspecified quadrant: N63.10

## 2020-10-08 HISTORY — DX: Prediabetes: R73.03

## 2020-10-08 HISTORY — PX: RADIOACTIVE SEED GUIDED EXCISIONAL BREAST BIOPSY: SHX6490

## 2020-10-08 HISTORY — DX: Type 2 diabetes mellitus without complications: E11.9

## 2020-10-08 SURGERY — RADIOACTIVE SEED GUIDED BREAST BIOPSY
Anesthesia: General | Site: Breast | Laterality: Right

## 2020-10-08 MED ORDER — FENTANYL CITRATE (PF) 100 MCG/2ML IJ SOLN
INTRAMUSCULAR | Status: DC | PRN
  Administered 2020-10-08 (×2): 50 ug via INTRAVENOUS

## 2020-10-08 MED ORDER — DIPHENHYDRAMINE HCL 50 MG/ML IJ SOLN
INTRAMUSCULAR | Status: DC | PRN
  Administered 2020-10-08: 12.5 mg via INTRAVENOUS

## 2020-10-08 MED ORDER — MEPERIDINE HCL 25 MG/ML IJ SOLN: 6.2500 mg | INTRAMUSCULAR | Status: DC | PRN

## 2020-10-08 MED ORDER — ONDANSETRON HCL 4 MG/2ML IJ SOLN
INTRAMUSCULAR | Status: DC | PRN
  Administered 2020-10-08: 4 mg via INTRAVENOUS

## 2020-10-08 MED ORDER — FENTANYL CITRATE (PF) 100 MCG/2ML IJ SOLN
25.0000 ug | INTRAMUSCULAR | Status: DC | PRN
  Administered 2020-10-08 (×2): 25 ug via INTRAVENOUS
  Administered 2020-10-08: 50 ug via INTRAVENOUS

## 2020-10-08 MED ORDER — CEFAZOLIN SODIUM-DEXTROSE 2-4 GM/100ML-% IV SOLN
INTRAVENOUS | Status: AC
  Filled 2020-10-08: qty 100

## 2020-10-08 MED ORDER — PROMETHAZINE HCL 25 MG/ML IJ SOLN: 6.2500 mg | INTRAMUSCULAR | Status: DC | PRN

## 2020-10-08 MED ORDER — LIDOCAINE HCL (CARDIAC) PF 100 MG/5ML IV SOSY
PREFILLED_SYRINGE | INTRAVENOUS | Status: DC | PRN
  Administered 2020-10-08: 100 mg via INTRAVENOUS

## 2020-10-08 MED ORDER — FENTANYL CITRATE (PF) 100 MCG/2ML IJ SOLN
INTRAMUSCULAR | Status: AC
  Filled 2020-10-08: qty 2

## 2020-10-08 MED ORDER — DEXAMETHASONE SODIUM PHOSPHATE 4 MG/ML IJ SOLN
INTRAMUSCULAR | Status: DC | PRN
  Administered 2020-10-08: 5 mg via INTRAVENOUS

## 2020-10-08 MED ORDER — ACETAMINOPHEN 500 MG PO TABS
1000.0000 mg | ORAL_TABLET | ORAL | Status: AC
  Administered 2020-10-08: 1000 mg via ORAL

## 2020-10-08 MED ORDER — TRAMADOL HCL 50 MG PO TABS: 100.0000 mg | ORAL_TABLET | Freq: Four times a day (QID) | ORAL | 0 refills | Status: DC | PRN

## 2020-10-08 MED ORDER — KETOROLAC TROMETHAMINE 15 MG/ML IJ SOLN
INTRAMUSCULAR | Status: AC
  Filled 2020-10-08: qty 1

## 2020-10-08 MED ORDER — TRAMADOL HCL 50 MG PO TABS
50.0000 mg | ORAL_TABLET | Freq: Once | ORAL | Status: AC
  Administered 2020-10-08: 50 mg via ORAL

## 2020-10-08 MED ORDER — PROPOFOL 10 MG/ML IV BOLUS
INTRAVENOUS | Status: DC | PRN
  Administered 2020-10-08: 200 mg via INTRAVENOUS

## 2020-10-08 MED ORDER — ACETAMINOPHEN 500 MG PO TABS
ORAL_TABLET | ORAL | Status: AC
  Filled 2020-10-08: qty 2

## 2020-10-08 MED ORDER — TRAMADOL HCL 50 MG PO TABS
ORAL_TABLET | ORAL | Status: AC
  Filled 2020-10-08: qty 1

## 2020-10-08 MED ORDER — CEFAZOLIN SODIUM-DEXTROSE 2-4 GM/100ML-% IV SOLN
2.0000 g | INTRAVENOUS | Status: AC
  Administered 2020-10-08: 2 g via INTRAVENOUS

## 2020-10-08 MED ORDER — MIDAZOLAM HCL 2 MG/2ML IJ SOLN
INTRAMUSCULAR | Status: AC
  Filled 2020-10-08: qty 2

## 2020-10-08 MED ORDER — LACTATED RINGERS IV SOLN: INTRAVENOUS | Status: DC

## 2020-10-08 MED ORDER — MIDAZOLAM HCL 5 MG/5ML IJ SOLN
INTRAMUSCULAR | Status: DC | PRN
  Administered 2020-10-08: 2 mg via INTRAVENOUS

## 2020-10-08 MED ORDER — BUPIVACAINE HCL (PF) 0.25 % IJ SOLN
INTRAMUSCULAR | Status: DC | PRN
  Administered 2020-10-08: 10 mL

## 2020-10-08 MED ORDER — KETOROLAC TROMETHAMINE 15 MG/ML IJ SOLN: 15.0000 mg | INTRAMUSCULAR | Status: DC

## 2020-10-08 SURGICAL SUPPLY — 51 items
APPLIER CLIP 9.375 MED OPEN (MISCELLANEOUS)
BINDER BREAST LRG (GAUZE/BANDAGES/DRESSINGS) IMPLANT
BINDER BREAST MEDIUM (GAUZE/BANDAGES/DRESSINGS) IMPLANT
BINDER BREAST XLRG (GAUZE/BANDAGES/DRESSINGS) IMPLANT
BINDER BREAST XXLRG (GAUZE/BANDAGES/DRESSINGS) IMPLANT
BLADE SURG 15 STRL LF DISP TIS (BLADE) ×1 IMPLANT
BLADE SURG 15 STRL SS (BLADE) ×1
CANISTER SUC SOCK COL 7IN (MISCELLANEOUS) IMPLANT
CANISTER SUCT 1200ML W/VALVE (MISCELLANEOUS) IMPLANT
CHLORAPREP W/TINT 26 (MISCELLANEOUS) ×2 IMPLANT
CLIP APPLIE 9.375 MED OPEN (MISCELLANEOUS) IMPLANT
CLIP TI WIDE RED SMALL 6 (CLIP) IMPLANT
COVER BACK TABLE 60X90IN (DRAPES) ×2 IMPLANT
COVER MAYO STAND STRL (DRAPES) ×2 IMPLANT
COVER PROBE W GEL 5X96 (DRAPES) ×2 IMPLANT
DECANTER SPIKE VIAL GLASS SM (MISCELLANEOUS) IMPLANT
DERMABOND ADVANCED (GAUZE/BANDAGES/DRESSINGS) ×1
DERMABOND ADVANCED .7 DNX12 (GAUZE/BANDAGES/DRESSINGS) ×1 IMPLANT
DRAPE LAPAROSCOPIC ABDOMINAL (DRAPES) ×2 IMPLANT
DRAPE UTILITY XL STRL (DRAPES) ×2 IMPLANT
DRSG TEGADERM 4X4.75 (GAUZE/BANDAGES/DRESSINGS) IMPLANT
ELECT COATED BLADE 2.86 ST (ELECTRODE) ×2 IMPLANT
ELECT REM PT RETURN 9FT ADLT (ELECTROSURGICAL) ×2
ELECTRODE REM PT RTRN 9FT ADLT (ELECTROSURGICAL) ×1 IMPLANT
GAUZE SPONGE 4X4 12PLY STRL LF (GAUZE/BANDAGES/DRESSINGS) IMPLANT
GLOVE SURG ENC MOIS LTX SZ7 (GLOVE) ×4 IMPLANT
GLOVE SURG UNDER POLY LF SZ7.5 (GLOVE) ×2 IMPLANT
GOWN STRL REUS W/ TWL LRG LVL3 (GOWN DISPOSABLE) ×2 IMPLANT
GOWN STRL REUS W/TWL LRG LVL3 (GOWN DISPOSABLE) ×2
HEMOSTAT ARISTA ABSORB 3G PWDR (HEMOSTASIS) IMPLANT
KIT MARKER MARGIN INK (KITS) ×2 IMPLANT
NEEDLE HYPO 25X1 1.5 SAFETY (NEEDLE) ×2 IMPLANT
NS IRRIG 1000ML POUR BTL (IV SOLUTION) IMPLANT
PACK BASIN DAY SURGERY FS (CUSTOM PROCEDURE TRAY) ×2 IMPLANT
PENCIL SMOKE EVACUATOR (MISCELLANEOUS) ×2 IMPLANT
RETRACTOR ONETRAX LX 90X20 (MISCELLANEOUS) IMPLANT
SLEEVE SCD COMPRESS KNEE MED (STOCKING) ×2 IMPLANT
SPONGE T-LAP 4X18 ~~LOC~~+RFID (SPONGE) ×2 IMPLANT
STRIP CLOSURE SKIN 1/2X4 (GAUZE/BANDAGES/DRESSINGS) ×2 IMPLANT
SUT MNCRL AB 4-0 PS2 18 (SUTURE) IMPLANT
SUT MON AB 5-0 PS2 18 (SUTURE) IMPLANT
SUT SILK 2 0 SH (SUTURE) IMPLANT
SUT VIC AB 2-0 SH 27 (SUTURE) ×1
SUT VIC AB 2-0 SH 27XBRD (SUTURE) ×1 IMPLANT
SUT VIC AB 3-0 SH 27 (SUTURE) ×1
SUT VIC AB 3-0 SH 27X BRD (SUTURE) ×1 IMPLANT
SYR CONTROL 10ML LL (SYRINGE) ×2 IMPLANT
TOWEL GREEN STERILE FF (TOWEL DISPOSABLE) ×2 IMPLANT
TRAY FAXITRON CT DISP (TRAY / TRAY PROCEDURE) ×2 IMPLANT
TUBE CONNECTING 20X1/4 (TUBING) IMPLANT
YANKAUER SUCT BULB TIP NO VENT (SUCTIONS) IMPLANT

## 2020-10-08 NOTE — Addendum Note (Signed)
Addendum  created 10/08/20 1555 by Lyn Hollingshead, MD   Order list changed, Pharmacy for encounter modified

## 2020-10-08 NOTE — Discharge Instructions (Addendum)
[facility] Office Phone Number [phone]  BREAST BIOPSY/ PARTIAL MASTECTOMY: POST OP INSTRUCTIONS Take 400 mg of ibuprofen every 8 hours or 650 mg tylenol every 6 hours for next 72 hours then as needed. Use ice several times daily also. Always review your discharge instruction sheet given to you by the facility where your surgery was performed.  IF YOU HAVE DISABILITY OR FAMILY LEAVE FORMS, YOU MUST BRING THEM TO THE OFFICE FOR PROCESSING.  DO NOT GIVE THEM TO YOUR DOCTOR.  A prescription for pain medication may be given to you upon discharge.  Take your pain medication as prescribed, if needed.  If narcotic pain medicine is not needed, then you may take acetaminophen (Tylenol), naprosyn (Alleve) or ibuprofen (Advil) as needed. Take your usually prescribed medications unless otherwise directed If you need a refill on your pain medication, please contact your pharmacy.  They will contact our office to request authorization.  Prescriptions will not be filled after 5pm or on week-ends. You should eat very light the first 24 hours after surgery, such as soup, crackers, pudding, etc.  Resume your normal diet the day after surgery. Most patients will experience some swelling and bruising in the breast.  Ice packs and a good support bra will help.  Wear the breast binder provided or a sports bra for 72 hours day and night.  After that wear a sports bra during the day until you return to the office. Swelling and bruising can take several days to resolve.  It is common to experience some constipation if taking pain medication after surgery.  Increasing fluid intake and taking a stool softener will usually help or prevent this problem from occurring.  A mild laxative (Milk of Magnesia or Miralax) should be taken according to package directions if there are no bowel movements after 48 hours. Unless discharge instructions indicate otherwise, you may remove your bandages 48 hours after surgery  and you may shower at that time.  You may have steri-strips (small skin tapes) in place directly over the incision.  These strips should be left on the skin for 7-10 days and will come off on their own.  If your surgeon used skin glue on the incision, you may shower in 24 hours.  The glue will flake off over the next 2-3 weeks.  Any sutures or staples will be removed at the office during your follow-up visit. ACTIVITIES:  You may resume regular daily activities (gradually increasing) beginning the next day.  Wearing a good support bra or sports bra minimizes pain and swelling.  You may have sexual intercourse when it is comfortable. You may drive when you no longer are taking prescription pain medication, you can comfortably wear a seatbelt, and you can safely maneuver your car and apply brakes. RETURN TO WORK:  ______________________________________________________________________________________ You should see your doctor in the office for a follow-up appointment approximately two weeks after your surgery.  Your doctor's nurse will typically make your follow-up appointment when she calls you with your pathology report.  Expect your pathology report 3-4 business days after your surgery.  You may call to check if you do not hear from us after three days. OTHER INSTRUCTIONS: _______________________________________________________________________________________________ _____________________________________________________________________________________________________________________________________ _____________________________________________________________________________________________________________________________________ _____________________________________________________________________________________________________________________________________  WHEN TO CALL DR WAKEFIELD: Fever over 101.0 Nausea and/or vomiting. Extreme swelling or bruising. Continued bleeding from incision. Increased  pain, redness, or drainage from the incision.  The clinic staff is available to answer your questions during regular business hours.  Please don't hesitate to call   and ask to speak to one of the nurses for clinical concerns.  If you have a medical emergency, go to the nearest emergency room or call 911.  A surgeon from [facility] is always on call at the hospital.  For further questions, please visit [email or web address]   Post Anesthesia Home Care Instructions  Activity: Get plenty of rest for the remainder of the day. A responsible individual must stay with you for 24 hours following the procedure.  For the next 24 hours, DO NOT: -Drive a car -Paediatric nurse -Drink alcoholic beverages -Take any medication unless instructed by your physician -Make any legal decisions or sign important papers.  Meals: Start with liquid foods such as gelatin or soup. Progress to regular foods as tolerated. Avoid greasy, spicy, heavy foods. If nausea and/or vomiting occur, drink only clear liquids until the nausea and/or vomiting subsides. Call your physician if vomiting continues.  Special Instructions/Symptoms: Your throat may feel dry or sore from the anesthesia or the breathing tube placed in your throat during surgery. If this causes discomfort, gargle with warm salt water. The discomfort should disappear within 24 hours.  If you had a scopolamine patch placed behind your ear for the management of post- operative nausea and/or vomiting:  1. The medication in the patch is effective for 72 hours, after which it should be removed.  Wrap patch in a tissue and discard in the trash. Wash hands thoroughly with soap and water. 2. You may remove the patch earlier than 72 hours if you experience unpleasant side effects which may include dry mouth, dizziness or visual disturbances. 3. Avoid touching the patch. Wash your hands with soap and water after contact with the patch.

## 2020-10-08 NOTE — Op Note (Signed)
Preoperative diagnosis: Right breast mass with core biopsy findings of fibroepithelial lesion Postoperative diagnosis: Same as above Procedure: Right breast radioactive seed guided excisional biopsy Surgeon: Dr. Serita Grammes Anesthesia: General Estimated blood loss: Minimal Specimens: Right breast tissue marked with paint containing seed and clip Complications: None Drains: None Sponge needle count was correct at completion Disposition to recovery stable condition  Indications: This a 55 year old female who underwent a screening mammogram with a right breast mass.  She had an ultrasound showing a 1.5 cm mass.  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  We discussed options and elected to proceed with a seed guided excision.  Procedure: After informed consent was obtained the patient was given antibiotics.  SCDs were in place.  She was taken to the operating room placed under general anesthesia.  She was prepped and draped in standard sterile surgical fashion.  A surgical timeout was then performed.  I located the seed in the superior central breast.  I infiltrated Marcaine all around this area.  I made a periareolar incision in order to hide the scar later.  I tunneled to the lesion and used the neoprobe to remove the seed and some of the surrounding tissue.  Mammogram confirmed removal of the seed and the clip.  I then obtained hemostasis.  She also wanted the biopsy site removed.  This was a little bit of a hematoma or some darkening of her skin.  I remove this with a 6 mm punch biopsy.  I then closed the breast with 2-0 Vicryl, 3-0 Vicryl, and 5-0 Monocryl.  I closed this punch biopsy site with 5-0 Monocryl.  Glue and Steri-Strips were applied over all these.  She tolerated this well was extubated transferred to recovery stable.

## 2020-10-08 NOTE — Anesthesia Preprocedure Evaluation (Signed)
Anesthesia Evaluation  Patient identified by MRN, date of birth, ID band Patient awake    Reviewed: Allergy & Precautions, NPO status , Patient's Chart, lab work & pertinent test results  Airway Mallampati: II  TM Distance: >3 FB Neck ROM: Full    Dental no notable dental hx. (+) Teeth Intact   Pulmonary neg pulmonary ROS,    Pulmonary exam normal        Cardiovascular hypertension, Pt. on medications and Pt. on home beta blockers Normal cardiovascular exam     Neuro/Psych PSYCHIATRIC DISORDERS Anxiety Depression negative neurological ROS     GI/Hepatic Neg liver ROS,   Endo/Other    Renal/GU negative Renal ROS  negative genitourinary   Musculoskeletal negative musculoskeletal ROS (+)   Abdominal (+) + obese,   Peds  Hematology   Anesthesia Other Findings   Reproductive/Obstetrics                             Anesthesia Physical Anesthesia Plan  ASA: 2  Anesthesia Plan: General   Post-op Pain Management:    Induction:   PONV Risk Score and Plan: 3 and Ondansetron, Midazolam and Treatment may vary due to age or medical condition  Airway Management Planned: LMA  Additional Equipment: None  Intra-op Plan:   Post-operative Plan: Extubation in OR  Informed Consent: I have reviewed the patients History and Physical, chart, labs and discussed the procedure including the risks, benefits and alternatives for the proposed anesthesia with the patient or authorized representative who has indicated his/her understanding and acceptance.     Dental advisory given  Plan Discussed with: CRNA  Anesthesia Plan Comments:         Anesthesia Quick Evaluation

## 2020-10-08 NOTE — Anesthesia Procedure Notes (Signed)
Procedure Name: LMA Insertion Date/Time: 10/08/2020 2:17 PM Performed by: Willa Frater, CRNA Pre-anesthesia Checklist: Patient identified, Emergency Drugs available, Suction available and Patient being monitored Patient Re-evaluated:Patient Re-evaluated prior to induction Oxygen Delivery Method: Circle system utilized Preoxygenation: Pre-oxygenation with 100% oxygen Induction Type: IV induction Ventilation: Mask ventilation without difficulty LMA: LMA inserted LMA Size: 4.0 Number of attempts: 1 Airway Equipment and Method: Bite block Placement Confirmation: positive ETCO2 Tube secured with: Tape Dental Injury: Teeth and Oropharynx as per pre-operative assessment

## 2020-10-08 NOTE — Transfer of Care (Signed)
Immediate Anesthesia Transfer of Care Note  Patient: Margaret Mills  Procedure(s) Performed: RADIOACTIVE SEED GUIDED EXCISIONAL RIGHT BREAST BIOPSY (Right: Breast)  Patient Location: PACU  Anesthesia Type:General  Level of Consciousness: awake, alert  and oriented  Airway & Oxygen Therapy: Patient Spontanous Breathing and Patient connected to face mask oxygen  Post-op Assessment: Report given to RN and Post -op Vital signs reviewed and stable  Post vital signs: Reviewed and stable  Last Vitals:  Vitals Value Taken Time  BP 129/85 10/08/20 1453  Temp    Pulse 92 10/08/20 1454  Resp    SpO2 96 % 10/08/20 1454  Vitals shown include unvalidated device data.  Last Pain:  Vitals:   10/08/20 1217  TempSrc: Oral  PainSc: 0-No pain      Patients Stated Pain Goal: 7 ([id number])  Complications: No notable events documented.

## 2020-10-08 NOTE — H&P (Signed)
55 y.o. female who is seen today as an office consultation at the request of Dr. Delena Bali for a right breast mass.   She has a prior history of a core biopsy with a hamartoma. She has a family history of breast cancer in her mom at age 71 and an aunt with ovarian cancer. She had no mass or discharge. She underwent a screening mammogram that shows a density breast. She was found to have a possible right breast mass. She then underwent an ultrasound that shows a 1.5 x 0.8 x 0.8 cm mass. This underwent core biopsy with the findings of a fibroepithelial lesion favoring a fibroadenoma but cannot rule out a low-grade phyllodes tumor. She is here to discuss her options today.  She works as a Emergency planning/management officer  Review of Systems: A complete review of systems was obtained from the patient. I have reviewed this information and discussed as appropriate with the patient. See HPI as well for other ROS.  Review of Systems  All other systems reviewed and are negative.   Medical History: Past Medical History:  Diagnosis Date   Anxiety   GERD (gastroesophageal reflux disease)   Hyperlipidemia   Hypertension   Sleep apnea   There is no problem list on file for this patient.  Past Surgical History:  Procedure Laterality Date   CESAREAN SECTION   LAPAROSCOPIC REMOVAL ECTOPIC PREGNANCY   TONSILLECTOMY    Allergies  Allergen Reactions   Nsaids (Non-Steroidal Anti-Inflammatory Drug) Diarrhea   Oxycodone-Acetaminophen Itching   Current Outpatient Medications on File Prior to Visit  Medication Sig Dispense Refill   ALPRAZolam (XANAX) 0.25 MG tablet Take 0.25 mg by mouth nightly as needed for Sleep   estrogen, conjugated,-medroxyprogesterone (PREMPRO) 0.3-1.5 mg tablet Take 1 tablet by mouth once daily   metoprolol tartrate (LOPRESSOR) 5 mg/5 mL injection Inject 5 mg into the vein once   rosuvastatin (CRESTOR) 10 MG tablet Take 10 mg by mouth once daily   venlafaxine (EFFEXOR) 100 MG tablet Take 100  mg by mouth 2 (two) times daily   valsartan (DIOVAN) 80 MG tablet Take by mouth   No current facility-administered medications on file prior to visit.   Family History  Problem Relation Age of Onset   Obesity Mother   High blood pressure (Hypertension) Mother   Hyperlipidemia (Elevated cholesterol) Mother   Breast cancer Mother   Skin cancer Father   High blood pressure (Hypertension) Father   Hyperlipidemia (Elevated cholesterol) Father   Coronary Artery Disease (Blocked arteries around heart) Father   Diabetes Father    Social History   Tobacco Use  Smoking Status Never Smoker  Smokeless Tobacco Never Used    Social History   Socioeconomic History   Marital status: Married  Tobacco Use   Smoking status: Never Smoker   Smokeless tobacco: Never Used  Substance and Sexual Activity   Alcohol use: Yes  Comment: weekends only (2)   Drug use: Never   Objective:   Vitals:  BP: (!) 140/86  Pulse: 93  Temp: 36.3 C (97.3 F)  SpO2: 97%  Weight: (!) 102.1 kg (225 lb)  Height: 165.1 cm ('5\' 5"'$ )   Body mass index is 37.44 kg/m.  Physical Exam Constitutional:  Appearance: Normal appearance.  Cardiovascular:  Rate and Rhythm: Normal rate.  Pulmonary:  Effort: Pulmonary effort is normal.  Chest:  Breasts:  Right: No mass or nipple discharge.  Left: No mass or nipple discharge.   Lymphadenopathy:  Upper Body:  Right  upper body: No supraclavicular or axillary adenopathy.  Left upper body: No supraclavicular or axillary adenopathy.  Neurological:  Mental Status: She is alert.     Assessment and Plan:   Subareolar mass of right breast   We discussed options including observation versus excision. I think excision would be the safer route. We discussed a radioactive seed guided excisional biopsy. At the same time I will remove the small dot that she has from the biopsy as well. We discussed surgery, risks, and recovery. We will proceed soon.

## 2020-10-08 NOTE — Anesthesia Postprocedure Evaluation (Signed)
Anesthesia Post Note  Patient: Margaret Mills  Procedure(s) Performed: RADIOACTIVE SEED GUIDED EXCISIONAL RIGHT BREAST BIOPSY (Right: Breast)     Patient location during evaluation: Phase II Anesthesia Type: General Level of consciousness: awake Pain management: pain level controlled Vital Signs Assessment: post-procedure vital signs reviewed and stable Respiratory status: spontaneous breathing Cardiovascular status: stable Postop Assessment: no apparent nausea or vomiting Anesthetic complications: no   No notable events documented.  Last Vitals:  Vitals:   10/08/20 1515 10/08/20 1530  BP: 133/84 138/76  Pulse: 70 70  Resp: 16 15  Temp:    SpO2: 93% 91%    Last Pain:  Vitals:   10/08/20 1525  TempSrc:   PainSc: [name]

## 2020-10-08 NOTE — Interval H&P Note (Signed)
History and Physical Interval Note:  10/08/2020 1:46 PM  Margaret Mills  has presented today for surgery, with the diagnosis of RIGHT BREAST MASS.  The various methods of treatment have been discussed with the patient and family. After consideration of risks, benefits and other options for treatment, the patient has consented to  Procedure(s): RADIOACTIVE SEED GUIDED EXCISIONAL RIGHT BREAST BIOPSY (Right) as a surgical intervention.  The patient's history has been reviewed, patient examined, no change in status, stable for surgery.  I have reviewed the patient's chart and labs.  Questions were answered to the patient's satisfaction.     Rolm Bookbinder

## 2020-10-08 NOTE — Addendum Note (Signed)
Addendum  created 10/08/20 1630 by Willa Frater, CRNA   Intraprocedure Meds edited

## 2020-10-10 ENCOUNTER — Encounter (HOSPITAL_BASED_OUTPATIENT_CLINIC_OR_DEPARTMENT_OTHER): Payer: Self-pay | Admitting: General Surgery

## 2020-10-10 LAB — SURGICAL PATHOLOGY

## 2020-11-13 ENCOUNTER — Other Ambulatory Visit: Payer: Self-pay

## 2020-11-13 ENCOUNTER — Ambulatory Visit: Payer: BC Managed Care – PPO

## 2020-11-13 DIAGNOSIS — R0683 Snoring: Secondary | ICD-10-CM

## 2020-11-20 ENCOUNTER — Other Ambulatory Visit: Payer: Self-pay

## 2020-11-20 ENCOUNTER — Ambulatory Visit: Payer: BC Managed Care – PPO

## 2020-11-20 DIAGNOSIS — G4733 Obstructive sleep apnea (adult) (pediatric): Secondary | ICD-10-CM | POA: Diagnosis not present

## 2020-11-27 ENCOUNTER — Telehealth: Payer: Self-pay | Admitting: Pulmonary Disease

## 2020-11-27 DIAGNOSIS — G4733 Obstructive sleep apnea (adult) (pediatric): Secondary | ICD-10-CM | POA: Diagnosis not present

## 2020-11-27 NOTE — Telephone Encounter (Signed)
HST 11/20/20 >> AHI 23.2, SpO2 low 83%  Please inform her that her sleep study shows moderate obstructive sleep apnea.  Please arrange for ROV with me or NP to discuss treatment options.

## 2020-11-28 NOTE — Telephone Encounter (Signed)
Called and spoke with pt and she is aware of results of sleep study per VS>  appt scheduled with EW on Monday at 3

## 2020-11-28 NOTE — Telephone Encounter (Signed)
Pt called me to get the results of her sleep study.

## 2020-12-01 ENCOUNTER — Encounter: Payer: Self-pay | Admitting: Primary Care

## 2020-12-01 ENCOUNTER — Ambulatory Visit (INDEPENDENT_AMBULATORY_CARE_PROVIDER_SITE_OTHER): Payer: BC Managed Care – PPO | Admitting: Primary Care

## 2020-12-01 ENCOUNTER — Other Ambulatory Visit: Payer: Self-pay

## 2020-12-01 VITALS — BP 110/70 | HR 97 | Temp 99.7°F | Ht 65.0 in | Wt 227.8 lb

## 2020-12-01 DIAGNOSIS — G4733 Obstructive sleep apnea (adult) (pediatric): Secondary | ICD-10-CM | POA: Insufficient documentation

## 2020-12-01 NOTE — Assessment & Plan Note (Addendum)
-   Patient has symptoms of snoring, restless sleep and daytime fatigue. HST 11/20/20 showed moderate OSA, AHI 23/hr. Discussed treatment options including weight loss, oral appliance, CPAP therapy or referral to ENT for possible surgical options. Sending in order for new CPAP start 5-15cm h20. Advised she wear CPAP every night for 4-6 hours or longer. Encourage weight loss efforts and safe driving practice. FU in 31-90 days after starting CPAP for compliance check.

## 2020-12-01 NOTE — Patient Instructions (Signed)
Nice meeting you today Margaret Mills  Sleep study showed moderate sleep apnea   Recommendations: - Aim to wear every night 4-6 hours or longer and during naps - Do not drive if experiencing excessive daytime sleepiness - Encourage weight loss efforts   Orders: - Auto CPAP 5-15cm h20, heated humidity, mask of choice (sleep study 11/20/20)   Follow-up:  - Please return to office for compliance check 31-90 days after receiving CPAP    CPAP and BPAP Information CPAP and BPAP (also called BiPAP) are methods that use air pressure to keep your airways open and to help you breathe well. CPAP and BPAP use different amounts of pressure. Your health care provider will tell you whether CPAP or BPAP would be more helpful for you. CPAP stands for "continuous positive airway pressure." With CPAP, the amount of pressure stays the same while you breathe in (inhale) and out (exhale). BPAP stands for "bi-level positive airway pressure." With BPAP, the amount of pressure will be higher when you inhale and lower when you exhale. This allows you to take larger breaths. CPAP or BPAP may be used in the hospital, or your health care provider may want you to use it at home. You may need to have a sleep study before your health care provider can order a machine for you to use at home. What are the advantages? CPAP or BPAP can be helpful if you have: Sleep apnea. Chronic obstructive pulmonary disease (COPD). Heart failure. Medical conditions that cause muscle weakness, including muscular dystrophy or amyotrophic lateral sclerosis (ALS). Other problems that cause breathing to be shallow, weak, abnormal, or difficult. CPAP and BPAP are most commonly used for obstructive sleep apnea (OSA) to keep the airways from collapsing when the muscles relax during sleep. What are the risks? Generally, this is a safe treatment. However, problems may occur, including: Irritated skin or skin sores if the mask does not fit  properly. Dry or stuffy nose or nosebleeds. Dry mouth. Feeling gassy or bloated. Sinus or lung infection if the equipment is not cleaned properly. When should CPAP or BPAP be used? In most cases, the mask only needs to be worn during sleep. Generally, the mask needs to be worn throughout the night and during any daytime naps. People with certain medical conditions may also need to wear the mask at other times, such as when they are awake. Follow instructions from your health care provider about when to use the machine. What happens during CPAP or BPAP? Both CPAP and BPAP are provided by a small machine with a flexible plastic tube that attaches to a plastic mask that you wear. Air is blown through the mask into your nose or mouth. The amount of pressure that is used to blow the air can be adjusted on the machine. Your health care provider will set the pressure setting and help you find the best mask for you. Tips for using the mask Because the mask needs to be snug, some people feel trapped or closed-in (claustrophobic) when first using the mask. If you feel this way, you may need to get used to the mask. One way to do this is to hold the mask loosely over your nose or mouth and then gradually apply the mask more snugly. You can also gradually increase the amount of time that you use the mask. Masks are available in various types and sizes. If your mask does not fit well, talk with your health care provider about getting a different one.  Some common types of masks include: Full face masks, which fit over the mouth and nose. Nasal masks, which fit over the nose. Nasal pillow or prong masks, which fit into the nostrils. If you are using a mask that fits over your nose and you tend to breathe through your mouth, a chin strap may be applied to help keep your mouth closed. Use a skin barrier to protect your skin as told by your health care provider. Some CPAP and BPAP machines have alarms that may sound  if the mask comes off or develops a leak. If you have trouble with the mask, it is very important that you talk with your health care provider about finding a way to make the mask easier to tolerate. Do not stop using the mask. There could be a negative impact on your health if you stop using the mask. Tips for using the machine Place your CPAP or BPAP machine on a secure table or stand near an electrical outlet. Know where the on/off switch is on the machine. Follow instructions from your health care provider about how to set the pressure on your machine and when you should use it. Do not eat or drink while the CPAP or BPAP machine is on. Food or fluids could get pushed into your lungs by the pressure of the CPAP or BPAP. For home use, CPAP and BPAP machines can be rented or purchased through home health care companies. Many different brands of machines are available. Renting a machine before purchasing may help you find out which particular machine works well for you. Your health insurance company may also decide which machine you may get. Keep the CPAP or BPAP machine and attachments clean. Ask your health care provider for specific instructions. Check the humidifier if you have a dry stuffy nose or nosebleeds. Make sure it is working correctly. Follow these instructions at home: Take over-the-counter and prescription medicines only as told by your health care provider. Ask if you can take sinus medicine if your sinuses are blocked. Do not use any products that contain nicotine or tobacco. These products include cigarettes, chewing tobacco, and vaping devices, such as e-cigarettes. If you need help quitting, ask your health care provider. Keep all follow-up visits. This is important. Contact a health care provider if: You have redness or pressure sores on your head, face, mouth, or nose from the mask or head gear. You have trouble using the CPAP or BPAP machine. You cannot tolerate wearing the  CPAP or BPAP mask. Someone tells you that you snore even when wearing your CPAP or BPAP. Get help right away if: You have trouble breathing. You feel confused. Summary CPAP and BPAP are methods that use air pressure to keep your airways open and to help you breathe well. If you have trouble with the mask, it is very important that you talk with your health care provider about finding a way to make the mask easier to tolerate. Do not stop using the mask. There could be a negative impact to your health if you stop using the mask. Follow instructions from your health care provider about when to use the machine. This information is not intended to replace advice given to you by your health care provider. Make sure you discuss any questions you have with your health care provider. Document Revised: 01/04/2020 Document Reviewed: 01/04/2020 Elsevier Patient Education  2022 [employer].

## 2020-12-01 NOTE — Progress Notes (Signed)
@Patient  ID: Margaret Mills, female    DOB: 1965-11-23, 55 y.o.   MRN: [id number]  No chief complaint on file.   Referring provider: Nicoletta Dress, MD  HPI: 55 year old female, never smoked. PMH significant for HTN, hyperlipidemia, IBS, breast mass, covid-19, anxiety, GERD,   Previous [facility] encounter:  09/16/20- Dr. Halford Chessman, consult  She has snored for years.  She is a restless sleeper.  She had colonoscopy recently and had low oxygen level.  She was advised by anesthesiologist to get sleep testing.  She has trouble staying awake watching TV.  She gets frequent leg symptoms at night.  She grinds her teeth.  She wakes up hearing herself snore, and has been told she stops breathing at night.   She goes to sleep at 930 pm.  She falls asleep in 10 minutes.  She wakes up some times to use the bathroom.  She gets out of bed at 445 am.  She feels tired in the morning.  She denies morning headache.  She takes xanax at 830 pm to help with anxiety and sleep.  She drinks 3 cups of coffee during the day.   She denies sleep walking, sleep talking, or nightmares.  She denies sleep hallucinations, sleep paralysis, or cataplexy.   The Epworth score is 8 out of 24.  12/01/2020- Interim hx  Patient presents today to discuss sleep study results. She is often very tired. 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 HST on 11/20/20 showed moderate OSA, AHI with SpO2 low 83%. Reviewed sleep study results and discussed treatment options including weight loss, oral appliance, CPAP to referral to ENT.    Allergies  Allergen Reactions   Nsaids     Stomach cramping   Percocet [Oxycodone-Acetaminophen]     itching     There is no immunization history on file for this patient.  Past Medical History:  Diagnosis Date   Anxiety    Breast mass    Breast mass, right    Bursitis of left shoulder    Calcific tendinitis of left shoulder    COVID-19 virus infection 07/2020   Depression    Diabetes mellitus without  complication (HCC)    GERD (gastroesophageal reflux disease)    Hyperlipidemia    Hypertension    IBS (irritable bowel syndrome)    Plantar fasciitis    Pre-diabetes    Vitamin D deficiency     Tobacco History: Social History   Tobacco Use  Smoking Status Never  Smokeless Tobacco Never   Counseling given: Not Answered   Outpatient Medications Prior to Visit  Medication Sig Dispense Refill   ALPRAZolam (XANAX) 0.25 MG tablet Take 0.25 mg by mouth at bedtime as needed for anxiety.     estrogen, conjugated,-medroxyprogesterone (PREMPRO) 0.3-1.5 MG tablet Take 1 tablet by mouth daily.     lactobacillus acidophilus (BACID) TABS tablet Take 2 tablets by mouth. daily     losartan (COZAAR) 25 MG tablet Take 25 mg by mouth daily.     metoprolol succinate (TOPROL-XL) 100 MG 24 hr tablet Take 100 mg by mouth daily. Take with or immediately following a meal.     Omega-3 Fatty Acids (FISH OIL) 1000 MG CAPS Take 1,000 mg by mouth. daily     SUPER B COMPLEX/C PO Take by mouth.     valsartan (DIOVAN) 40 MG tablet Take 40 mg by mouth daily.     venlafaxine (EFFEXOR) 75 MG tablet Take 75 mg by mouth.  daily     traMADol (ULTRAM) 50 MG tablet Take 2 tablets (100 mg total) by mouth every 6 (six) hours as needed. 8 tablet 0   No facility-administered medications prior to visit.    Review of Systems  Review of Systems  Constitutional:  Positive for fatigue.  HENT: Negative.    Respiratory: Negative.    Psychiatric/Behavioral:  Positive for sleep disturbance.     Physical Exam  BP 110/70 (BP Location: Left Arm, Patient Position: Sitting, Cuff Size: Normal)   Pulse 97   Temp 99.7 F (37.6 C) (Oral)   Ht 5\' 5"  (1.651 m)   Wt 227 lb 12.8 oz (103.3 kg)   SpO2 95%   BMI 37.91 kg/m  Physical Exam Constitutional:      Appearance: Normal appearance.  HENT:     Head: Normocephalic and atraumatic.     Mouth/Throat:     Mouth: Mucous membranes are moist.     Pharynx: Oropharynx is clear.   Cardiovascular:     Rate and Rhythm: Normal rate and regular rhythm.  Pulmonary:     Effort: Pulmonary effort is normal.     Breath sounds: Normal breath sounds.  Skin:    General: Skin is warm and dry.  Neurological:     General: No focal deficit present.     Mental Status: She is alert and oriented to person, place, and time. Mental status is at baseline.  Psychiatric:        Mood and Affect: Mood normal.        Behavior: Behavior normal.        Thought Content: Thought content normal.        Judgment: Judgment normal.     Lab Results:  CBC No results found for: WBC, RBC, HGB, HCT, PLT, MCV, MCH, MCHC, RDW, LYMPHSABS, MONOABS, EOSABS, BASOSABS  BMET    Component Value Date/Time   NA 139 09/16/2020 1054   K 4.2 09/16/2020 1054   CL 102 09/16/2020 1054   CO2 30 09/16/2020 1054   GLUCOSE 107 (H) 09/16/2020 1054   BUN 14 09/16/2020 1054   CREATININE 0.71 09/16/2020 1054   CALCIUM 9.4 09/16/2020 1054    BNP No results found for: BNP  ProBNP No results found for: PROBNP  Imaging: No results found.   Assessment & Plan:   Moderate obstructive sleep apnea - Patient has symptoms of snoring, restless sleep and daytime fatigue. HST 11/20/20 showed moderate OSA, AHI 23/hr. Discussed treatment options including weight loss, oral appliance, CPAP therapy or referral to ENT for possible surgical options. Sending in order for new CPAP start 5-15cm h20. Advised she wear CPAP every night for 4-6 hours or longer. Encourage weight loss efforts and safe driving practice. FU in 31-90 days after starting CPAP for compliance check.    Martyn Ehrich, NP 12/01/2020

## 2021-01-20 DIAGNOSIS — Z01419 Encounter for gynecological examination (general) (routine) without abnormal findings: Secondary | ICD-10-CM | POA: Diagnosis not present

## 2021-01-20 DIAGNOSIS — Z79890 Hormone replacement therapy (postmenopausal): Secondary | ICD-10-CM | POA: Diagnosis not present

## 2021-01-21 DIAGNOSIS — G4733 Obstructive sleep apnea (adult) (pediatric): Secondary | ICD-10-CM | POA: Diagnosis not present

## 2021-02-05 DIAGNOSIS — R7301 Impaired fasting glucose: Secondary | ICD-10-CM | POA: Diagnosis not present

## 2021-02-05 DIAGNOSIS — I10 Essential (primary) hypertension: Secondary | ICD-10-CM | POA: Diagnosis not present

## 2021-02-05 DIAGNOSIS — R079 Chest pain, unspecified: Secondary | ICD-10-CM | POA: Diagnosis not present

## 2021-02-05 DIAGNOSIS — E785 Hyperlipidemia, unspecified: Secondary | ICD-10-CM | POA: Diagnosis not present

## 2021-02-05 DIAGNOSIS — F330 Major depressive disorder, recurrent, mild: Secondary | ICD-10-CM | POA: Diagnosis not present

## 2021-02-05 DIAGNOSIS — E559 Vitamin D deficiency, unspecified: Secondary | ICD-10-CM | POA: Diagnosis not present

## 2021-02-08 DIAGNOSIS — I10 Essential (primary) hypertension: Secondary | ICD-10-CM | POA: Diagnosis not present

## 2021-02-08 DIAGNOSIS — S61012A Laceration without foreign body of left thumb without damage to nail, initial encounter: Secondary | ICD-10-CM | POA: Diagnosis not present

## 2021-02-08 DIAGNOSIS — W260XXA Contact with knife, initial encounter: Secondary | ICD-10-CM | POA: Diagnosis not present

## 2021-02-08 DIAGNOSIS — Z6838 Body mass index (BMI) 38.0-38.9, adult: Secondary | ICD-10-CM | POA: Diagnosis not present

## 2021-02-08 DIAGNOSIS — E669 Obesity, unspecified: Secondary | ICD-10-CM | POA: Diagnosis not present

## 2021-02-10 DIAGNOSIS — G4730 Sleep apnea, unspecified: Secondary | ICD-10-CM | POA: Diagnosis not present

## 2021-02-10 DIAGNOSIS — R079 Chest pain, unspecified: Secondary | ICD-10-CM | POA: Diagnosis not present

## 2021-02-17 ENCOUNTER — Ambulatory Visit: Payer: BC Managed Care – PPO | Admitting: Primary Care

## 2021-02-17 ENCOUNTER — Other Ambulatory Visit: Payer: Self-pay

## 2021-02-17 ENCOUNTER — Encounter: Payer: Self-pay | Admitting: Primary Care

## 2021-02-17 DIAGNOSIS — G4733 Obstructive sleep apnea (adult) (pediatric): Secondary | ICD-10-CM

## 2021-02-17 NOTE — Assessment & Plan Note (Addendum)
-   Patient is 83% compliant with CPAP > 4 hours and reports significant benefit from use. Pressure 5-15cm h20 (9.8cm h20- 95%) with residual AHI 2.3. She is having some minor issues with air leaks, she will try adjusting her full face mask and if still occurring recommend she try CPAP mask liner  - Continue to wear CPAP every night, aim to wear every night 4-6 hours a night - Encourage patient continue to work on weight loss effort and advised against driving if experiencing excessive daytime sleepiness - FU in 1 year or sooner if needed

## 2021-02-17 NOTE — Progress Notes (Signed)
Reviewed and agree with assessment/plan.   Chesley Mires, MD [facility] 02/17/2021, 4:03 PM Pager:  [phone]

## 2021-02-17 NOTE — Patient Instructions (Addendum)
Recommendations: Continue to wear CPAP every night, aim to wear every night 4-6 hours a night Continue to work on weight loss effort Do not drive if experiencing excessive daytime sleepiness If having issues with air leaks you can look into getting CPAP mask liners on [employer].com or through [employer] Notify us if having increase episodes of apnea   What do CPAP mask liners do? CPAP mask liners are fabric sheaths that provide a barrier between the CPAP mask cushion and the user's skin. Most are marketed as preventing air leaks, protecting skin from red marks or irritation, and shielding the mask from body oils  Follow-up: 1 year with [facility] NP or sooner if needed    CPAP and BIPAP Information CPAP and BIPAP are methods that use air pressure to keep your airways open and to help you breathe well. CPAP and BIPAP use different amounts of pressure. Your health care provider will tell you whether CPAP or BIPAP would be more helpful for you. CPAP stands for "continuous positive airway pressure." With CPAP, the amount of pressure stays the same while you breathe in (inhale) and out (exhale). BIPAP stands for "bi-level positive airway pressure." With BIPAP, the amount of pressure will be higher when you inhale and lower when you exhale. This allows you to take larger breaths. CPAP or BIPAP may be used in the hospital, or your health care provider may want you to use it at home. You may need to have a sleep study before your health care provider can order a machine for you to use at home. What are the advantages? CPAP or BIPAP can be helpful if you have: Sleep apnea. Chronic obstructive pulmonary disease (COPD). Heart failure. Medical conditions that cause muscle weakness, including muscular dystrophy or amyotrophic lateral sclerosis (ALS). Other problems that cause breathing to be shallow, weak, abnormal, or difficult. CPAP and BIPAP are most commonly used for obstructive sleep apnea (OSA) to keep the  airways from collapsing when the muscles relax during sleep. What are the risks? Generally, this is a safe treatment. However, problems may occur, including: Irritated skin or skin sores if the mask does not fit properly. Dry or stuffy nose or nosebleeds. Dry mouth. Feeling gassy or bloated. Sinus or lung infection if the equipment is not cleaned properly. When should CPAP or BIPAP be used? In most cases, the mask only needs to be worn during sleep. Generally, the mask needs to be worn throughout the night and during any daytime naps. People with certain medical conditions may also need to wear the mask at other times, such as when they are awake. Follow instructions from your health care provider about when to use the machine. What happens during CPAP or BIPAP? Both CPAP and BIPAP are provided by a small machine with a flexible plastic tube that attaches to a plastic mask that you wear. Air is blown through the mask into your nose or mouth. The amount of pressure that is used to blow the air can be adjusted on the machine. Your health care provider will set the pressure setting and help you find the best mask for you. Tips for using the mask Because the mask needs to be snug, some people feel trapped or closed-in (claustrophobic) when first using the mask. If you feel this way, you may need to get used to the mask. One way to do this is to hold the mask loosely over your nose or mouth and then gradually apply the mask more snugly. You can  also gradually increase the amount of time that you use the mask. Masks are available in various types and sizes. If your mask does not fit well, talk with your health care provider about getting a different one. Some common types of masks include: Full face masks, which fit over the mouth and nose. Nasal masks, which fit over the nose. Nasal pillow or prong masks, which fit into the nostrils. If you are using a mask that fits over your nose and you tend to  breathe through your mouth, a chin strap may be applied to help keep your mouth closed. Use a skin barrier to protect your skin as told by your health care provider. Some CPAP and BIPAP machines have alarms that may sound if the mask comes off or develops a leak. If you have trouble with the mask, it is very important that you talk with your health care provider about finding a way to make the mask easier to tolerate. Do not stop using the mask. There could be a negative impact on your health if you stop using the mask. Tips for using the machine Place your CPAP or BIPAP machine on a secure table or stand near an electrical outlet. Know where the on/off switch is on the machine. Follow instructions from your health care provider about how to set the pressure on your machine and when you should use it. Do not eat or drink while the CPAP or BIPAP machine is on. Food or fluids could get pushed into your lungs by the pressure of the CPAP or BIPAP. For home use, CPAP and BIPAP machines can be rented or purchased through home health care companies. Many different brands of machines are available. Renting a machine before purchasing may help you find out which particular machine works well for you. Your health insurance company may also decide which machine you may get. Keep the CPAP or BIPAP machine and attachments clean. Ask your health care provider for specific instructions. Check the humidifier if you have a dry stuffy nose or nosebleeds. Make sure it is working correctly. Follow these instructions at home: Take over-the-counter and prescription medicines only as told by your health care provider. Ask if you can take sinus medicine if your sinuses are blocked. Do not use any products that contain nicotine or tobacco. These products include cigarettes, chewing tobacco, and vaping devices, such as e-cigarettes. If you need help quitting, ask your health care provider. Keep all follow-up visits. This is  important. Contact a health care provider if: You have redness or pressure sores on your head, face, mouth, or nose from the mask or head gear. You have trouble using the CPAP or BIPAP machine. You cannot tolerate wearing the CPAP or BIPAP mask. Someone tells you that you snore even when wearing your CPAP or BIPAP. Get help right away if: You have trouble breathing. You feel confused. Summary CPAP and BIPAP are methods that use air pressure to keep your airways open and to help you breathe well. If you have trouble with the mask, it is very important that you talk with your health care provider about finding a way to make the mask easier to tolerate. Do not stop using the mask. There could be a negative impact to your health if you stop using the mask. Follow instructions from your health care provider about when to use the machine. This information is not intended to replace advice given to you by your health care provider. Make sure you  discuss any questions you have with your health care provider. Document Revised: 09/03/2020 Document Reviewed: 01/04/2020 Elsevier Patient Education  2022 [employer].

## 2021-02-17 NOTE — Progress Notes (Signed)
@Patient  ID: Margaret Mills, female    DOB: December 11, 1965, 56 y.o.   MRN: [id number]  Chief Complaint  Patient presents with   Follow-up    Patient is here for cpap f/u. Patient has no complaints.    Referring provider: Nicoletta Dress, MD  HPI: 56 year old female, never smoked. PMH significant for moderate OSA, HTN, hyperlipidemia, IBS, breast mass, covid-19, anxiety, GERD. Patient of Dr. Halford Chessman.   Previous LB encounter:  09/16/20- Dr. Halford Chessman, consult  She has snored for years.  She is a restless sleeper.  She had colonoscopy recently and had low oxygen level.  She was advised by anesthesiologist to get sleep testing.  She has trouble staying awake watching TV.  She gets frequent leg symptoms at night.  She grinds her teeth.  She wakes up hearing herself snore, and has been told she stops breathing at night.   She goes to sleep at 930 pm.  She falls asleep in 10 minutes.  She wakes up some times to use the bathroom.  She gets out of bed at 445 am.  She feels tired in the morning.  She denies morning headache.  She takes xanax at 830 pm to help with anxiety and sleep.  She drinks 3 cups of coffee during the day.   She denies sleep walking, sleep talking, or nightmares.  She denies sleep hallucinations, sleep paralysis, or cataplexy.   The Epworth score is 8 out of 24.  12/01/2020 Patient presents today to discuss sleep study results. She is often very tired. 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 HST on 11/20/20 showed moderate OSA, AHI with SpO2 low 83%. Reviewed sleep study results and discussed treatment options including weight loss, oral appliance, CPAP to referral to ENT.   02/17/2021- Interim hx  Patient presents today for 6 month follow-up moderate OSA. She was started on CPAP in fall 2022. She reports compliance with CPAP use. There were 5 days that her machine did not connect to airview, despite this she is 83% compliant with CPAP use > 4 hours. She is feeling significantly better  since being started on CPAP. She reports having more energy when she gets home from work. She has had some minor issues with airleaks with her full faced mask. Her weight did go up 7 lbs over the holidays and she is now following a keto diet to help with this.   Airview download 01/16/21- 02/14/21 Usage 25/30 days; 83% > 4 hours Average usage 7 hours 23 mins Pressure 5-15cm h20 (9.8cm h20-95%) Airleaks 9.2L/min (95%) AHI 2.3   Allergies  Allergen Reactions   Nsaids     Stomach cramping   Percocet [Oxycodone-Acetaminophen]     itching    Immunization History  Administered Date(s) Administered   Tdap 02/08/2021    Past Medical History:  Diagnosis Date   Anxiety    Breast mass    Breast mass, right    Bursitis of left shoulder    Calcific tendinitis of left shoulder    COVID-19 virus infection 07/2020   Depression    Diabetes mellitus without complication (HCC)    GERD (gastroesophageal reflux disease)    Hyperlipidemia    Hypertension    IBS (irritable bowel syndrome)    Plantar fasciitis    Pre-diabetes    Vitamin D deficiency     Tobacco History: Social History   Tobacco Use  Smoking Status Never  Smokeless Tobacco Never   Counseling given: Not Answered  Outpatient Medications Prior to Visit  Medication Sig Dispense Refill   ALPRAZolam (XANAX) 0.25 MG tablet Take 0.25 mg by mouth at bedtime as needed for anxiety.     estrogen, conjugated,-medroxyprogesterone (PREMPRO) 0.3-1.5 MG tablet Take 1 tablet by mouth daily.     lactobacillus acidophilus (BACID) TABS tablet Take 2 tablets by mouth. daily     losartan (COZAAR) 25 MG tablet Take 25 mg by mouth daily.     metoprolol succinate (TOPROL-XL) 100 MG 24 hr tablet Take 100 mg by mouth daily. Take with or immediately following a meal.     Omega-3 Fatty Acids (FISH OIL) 1000 MG CAPS Take 1,000 mg by mouth. daily     SUPER B COMPLEX/C PO Take by mouth.     valsartan (DIOVAN) 40 MG tablet Take 40 mg by mouth daily.      venlafaxine (EFFEXOR) 75 MG tablet Take 75 mg by mouth. daily     No facility-administered medications prior to visit.    Review of Systems  Review of Systems  Constitutional:  Negative for fatigue.  HENT: Negative.    Respiratory: Negative.  Negative for apnea.   Psychiatric/Behavioral: Negative.      Physical Exam  BP 108/70 (BP Location: Right Arm, Patient Position: Sitting, Cuff Size: Normal)    Pulse 86    Temp 98.7 F (37.1 C) (Oral)    Ht 5\' 5"  (1.651 m)    Wt 231 lb 9.6 oz (105.1 kg)    SpO2 96%    BMI 38.54 kg/m  Physical Exam Constitutional:      Appearance: Normal appearance.  HENT:     Head: Normocephalic and atraumatic.     Mouth/Throat:     Mouth: Mucous membranes are moist.     Pharynx: Oropharynx is clear.     Comments: Mallampati class I Cardiovascular:     Rate and Rhythm: Normal rate and regular rhythm.  Pulmonary:     Effort: Pulmonary effort is normal.     Breath sounds: Normal breath sounds. No wheezing, rhonchi or rales.  Neurological:     General: No focal deficit present.     Mental Status: She is alert and oriented to person, place, and time. Mental status is at baseline.  Psychiatric:        Mood and Affect: Mood normal.        Behavior: Behavior normal.        Thought Content: Thought content normal.        Judgment: Judgment normal.     Lab Results:  CBC No results found for: WBC, RBC, HGB, HCT, PLT, MCV, MCH, MCHC, RDW, LYMPHSABS, MONOABS, EOSABS, BASOSABS  BMET    Component Value Date/Time   NA 139 09/16/2020 1054   K 4.2 09/16/2020 1054   CL 102 09/16/2020 1054   CO2 30 09/16/2020 1054   GLUCOSE 107 (H) 09/16/2020 1054   BUN 14 09/16/2020 1054   CREATININE 0.71 09/16/2020 1054   CALCIUM 9.4 09/16/2020 1054    BNP No results found for: BNP  ProBNP No results found for: PROBNP  Imaging: No results found.   Assessment & Plan:   Moderate obstructive sleep apnea - Patient is 83% compliant with CPAP > 4 hours and  reports significant benefit from use. Pressure 5-15cm h20 (9.8cm h20- 95%) with residual AHI 2.3. She is having some minor issues with air leaks, she will try adjusting her full face mask and if still occurring recommend she try CPAP mask liner  -  Continue to wear CPAP every night, aim to wear every night 4-6 hours a night - Encourage patient continue to work on weight loss effort and advised against driving if experiencing excessive daytime sleepiness - FU in 1 year or sooner if needed    Martyn Ehrich, NP 02/17/2021

## 2021-02-21 DIAGNOSIS — G4733 Obstructive sleep apnea (adult) (pediatric): Secondary | ICD-10-CM | POA: Diagnosis not present

## 2021-03-24 DIAGNOSIS — G4733 Obstructive sleep apnea (adult) (pediatric): Secondary | ICD-10-CM | POA: Diagnosis not present

## 2021-04-14 DIAGNOSIS — K769 Liver disease, unspecified: Secondary | ICD-10-CM | POA: Diagnosis not present

## 2021-04-14 DIAGNOSIS — R399 Unspecified symptoms and signs involving the genitourinary system: Secondary | ICD-10-CM | POA: Diagnosis not present

## 2021-04-14 DIAGNOSIS — Z87442 Personal history of urinary calculi: Secondary | ICD-10-CM | POA: Diagnosis not present

## 2021-04-14 DIAGNOSIS — N202 Calculus of kidney with calculus of ureter: Secondary | ICD-10-CM | POA: Diagnosis not present

## 2021-04-14 DIAGNOSIS — R1032 Left lower quadrant pain: Secondary | ICD-10-CM | POA: Diagnosis not present

## 2021-04-14 DIAGNOSIS — R932 Abnormal findings on diagnostic imaging of liver and biliary tract: Secondary | ICD-10-CM | POA: Diagnosis not present

## 2021-04-14 DIAGNOSIS — Z6837 Body mass index (BMI) 37.0-37.9, adult: Secondary | ICD-10-CM | POA: Diagnosis not present

## 2021-04-14 DIAGNOSIS — N132 Hydronephrosis with renal and ureteral calculous obstruction: Secondary | ICD-10-CM | POA: Diagnosis not present

## 2021-04-14 DIAGNOSIS — E669 Obesity, unspecified: Secondary | ICD-10-CM | POA: Diagnosis not present

## 2021-04-14 DIAGNOSIS — I10 Essential (primary) hypertension: Secondary | ICD-10-CM | POA: Diagnosis not present

## 2021-04-14 DIAGNOSIS — R109 Unspecified abdominal pain: Secondary | ICD-10-CM | POA: Diagnosis not present

## 2021-04-14 DIAGNOSIS — Z8719 Personal history of other diseases of the digestive system: Secondary | ICD-10-CM | POA: Diagnosis not present

## 2021-04-14 DIAGNOSIS — E785 Hyperlipidemia, unspecified: Secondary | ICD-10-CM | POA: Diagnosis not present

## 2021-04-15 DIAGNOSIS — N200 Calculus of kidney: Secondary | ICD-10-CM | POA: Diagnosis not present

## 2021-04-15 DIAGNOSIS — R160 Hepatomegaly, not elsewhere classified: Secondary | ICD-10-CM | POA: Diagnosis not present

## 2021-04-21 DIAGNOSIS — G4733 Obstructive sleep apnea (adult) (pediatric): Secondary | ICD-10-CM | POA: Diagnosis not present

## 2021-04-27 DIAGNOSIS — K7689 Other specified diseases of liver: Secondary | ICD-10-CM | POA: Diagnosis not present

## 2021-04-27 DIAGNOSIS — N281 Cyst of kidney, acquired: Secondary | ICD-10-CM | POA: Diagnosis not present

## 2021-04-27 DIAGNOSIS — R160 Hepatomegaly, not elsewhere classified: Secondary | ICD-10-CM | POA: Diagnosis not present

## 2021-05-19 DIAGNOSIS — J019 Acute sinusitis, unspecified: Secondary | ICD-10-CM | POA: Diagnosis not present

## 2021-05-27 ENCOUNTER — Encounter (HOSPITAL_COMMUNITY): Payer: Self-pay

## 2021-08-15 IMAGING — US US BREAST*R* LIMITED INC AXILLA
1 series · 11 of 11 positions shown · non-contrast
Comparison: Previous exam(s).

CLINICAL DATA: 54-year-old female presenting as a recall from
screening for possible right breast mass. Patient has a history of
prior benign left breast biopsies.

EXAM:
ULTRASOUND OF THE RIGHT BREAST

[Series 1: us breast*right* limited inc axilla · 0.08mm/px · 11 acquisitions, 11 frames shown]
[im 1/11]
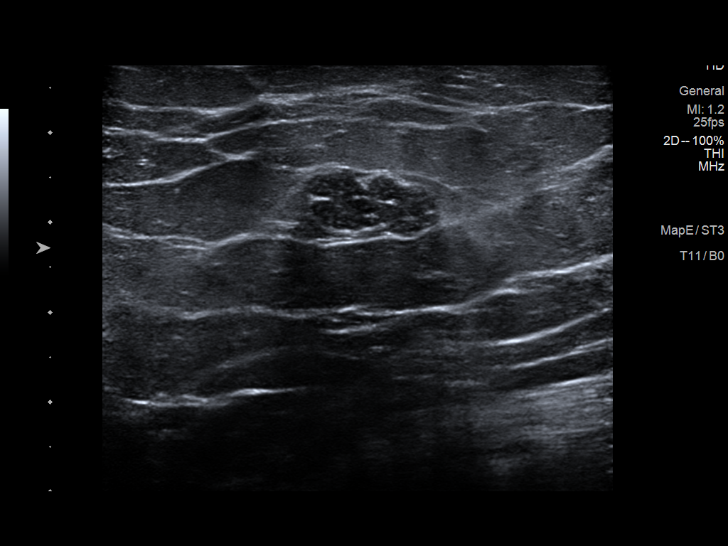
[im 2/11]
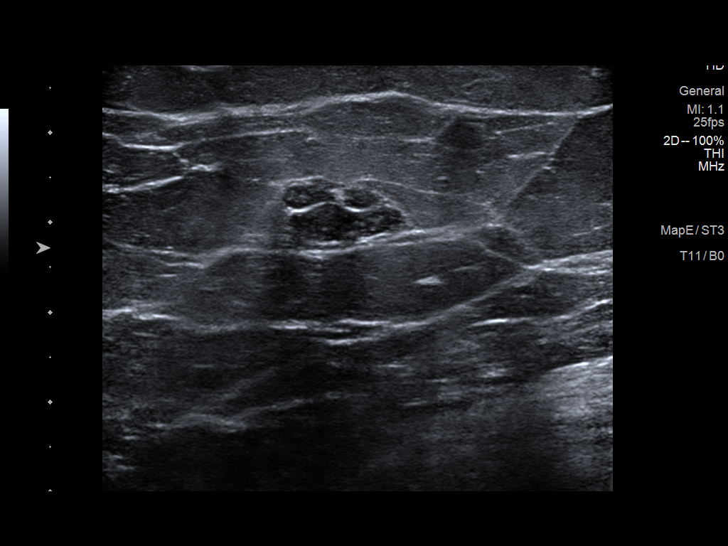
[im 3/11]
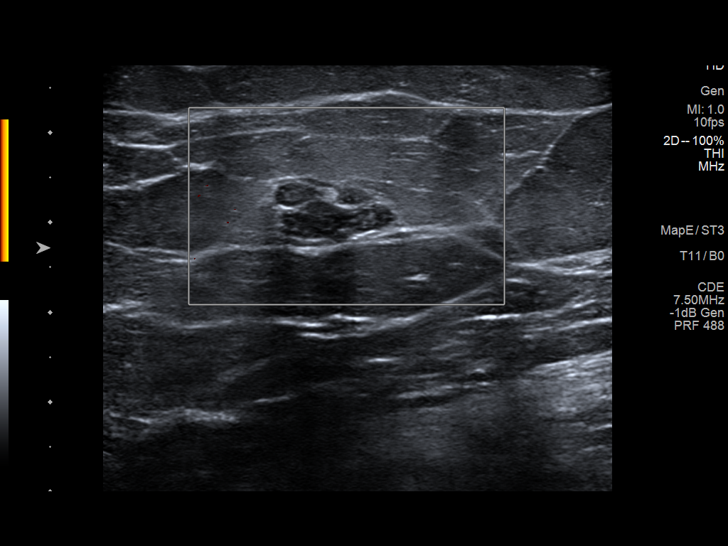
[im 4/11]
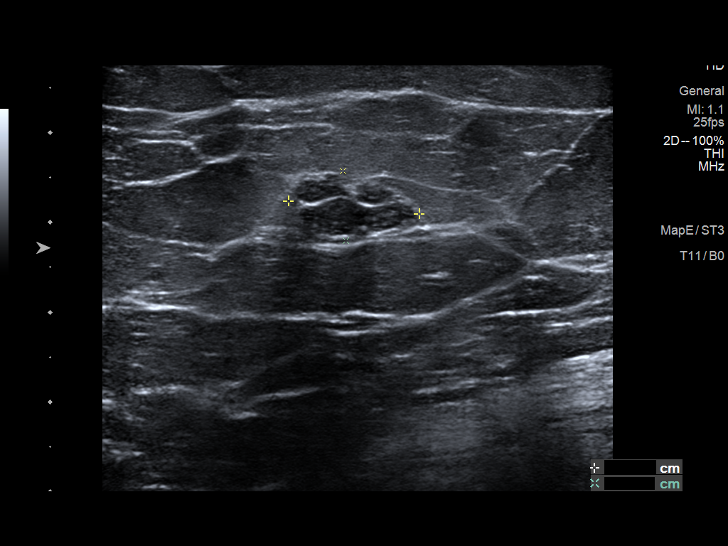
[im 5/11]
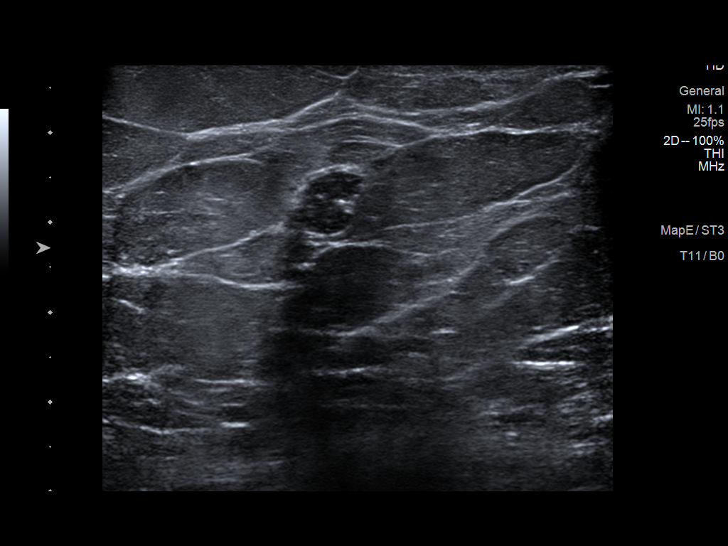
[im 6/11]
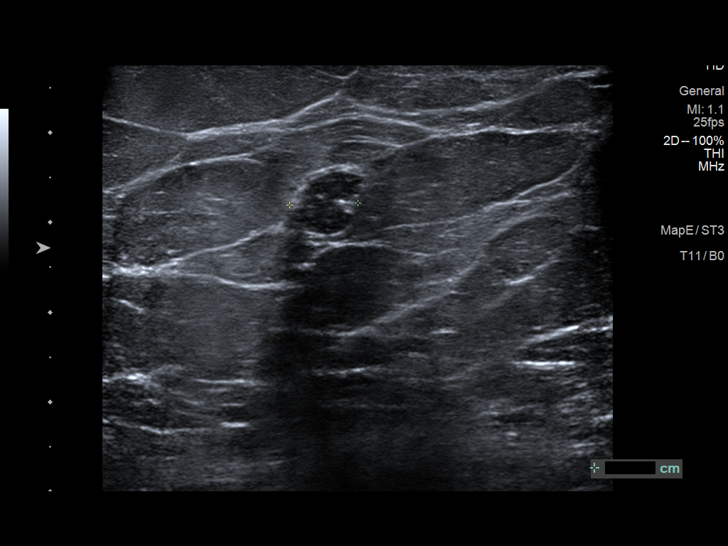
[im 7/11]
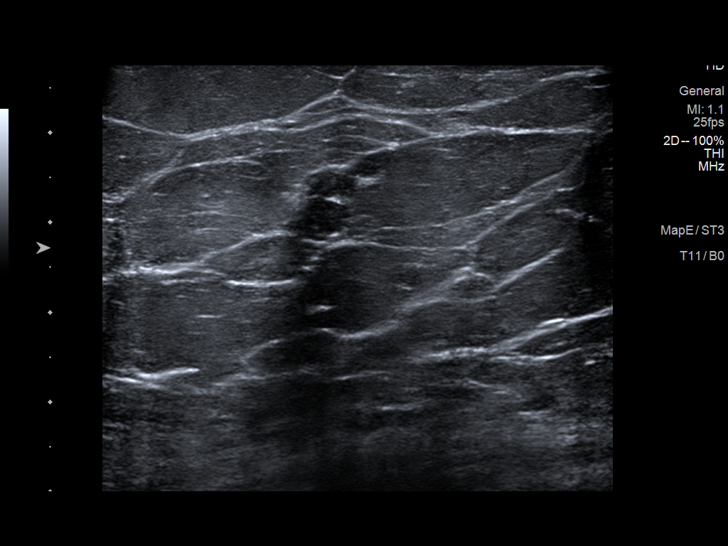
[im 8/11]
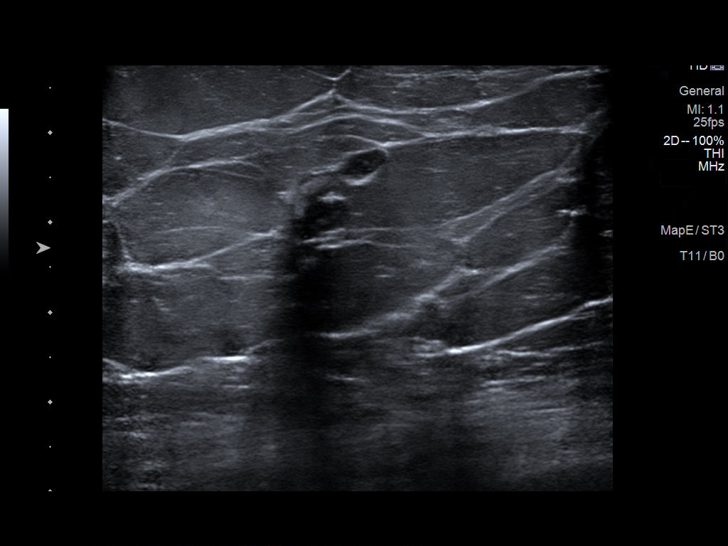
[im 9/11]
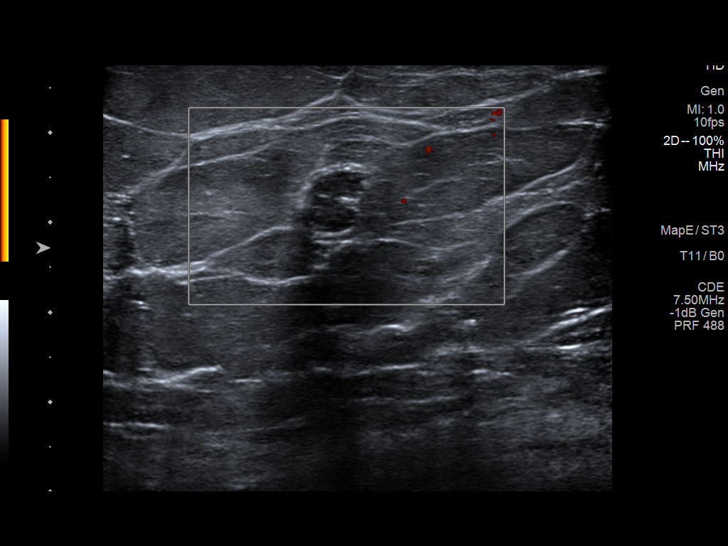
[im 10/11]
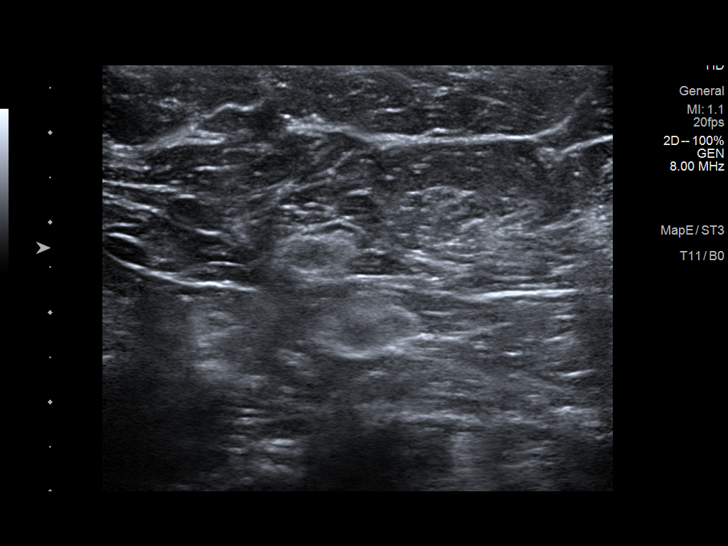
[im 11/11]
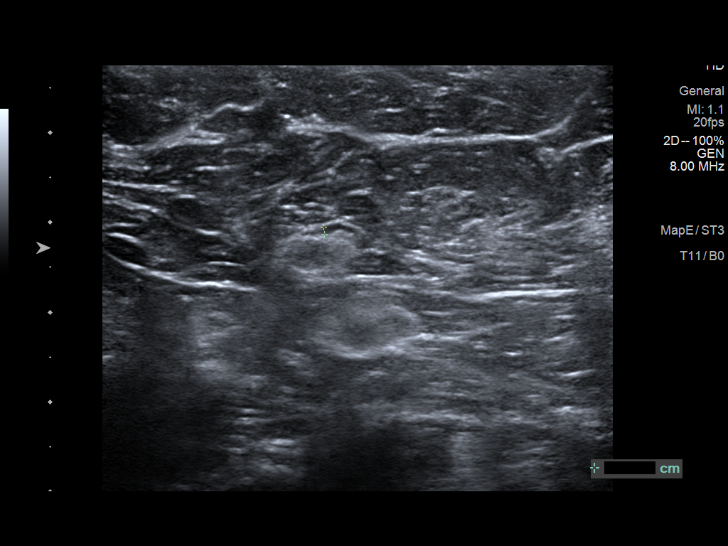

[11 of 11 positions shown; findings below may reference images not displayed]

FINDINGS: Targeted ultrasound is performed in the right breast at 12 o'clock 2
cm from the nipple demonstrating an oval circumscribed hypoechoic
mass with thin septation measuring 1.5 x 0.8 x 0.8 cm. No internal
vascularity. This corresponds to the mass identified
mammographically.
IMPRESSION: Indeterminate mass in the right breast at 12 o'clock measuring
cm.

RECOMMENDATION:
Ultrasound-guided core needle biopsy of the right breast mass at 12
o'clock.

I have discussed the findings and recommendations with the patient
who agrees to proceed with biopsy. The patient will be scheduled for
the biopsy appointment prior to leaving the office today.

BI-RADS CATEGORY  4: Suspicious.

## 2021-08-20 IMAGING — MG MM BREAST LOCALIZATION CLIP
4 series · 4 of 12 positions shown · non-contrast
Comparison: Previous exam(s).

CLINICAL DATA: Evaluate RIBBON clip placement following
ultrasound-guided RIGHT breast biopsy.

EXAM:
DIAGNOSTIC RIGHT MAMMOGRAM POST ULTRASOUND BIOPSY

[R CC synth-2D]
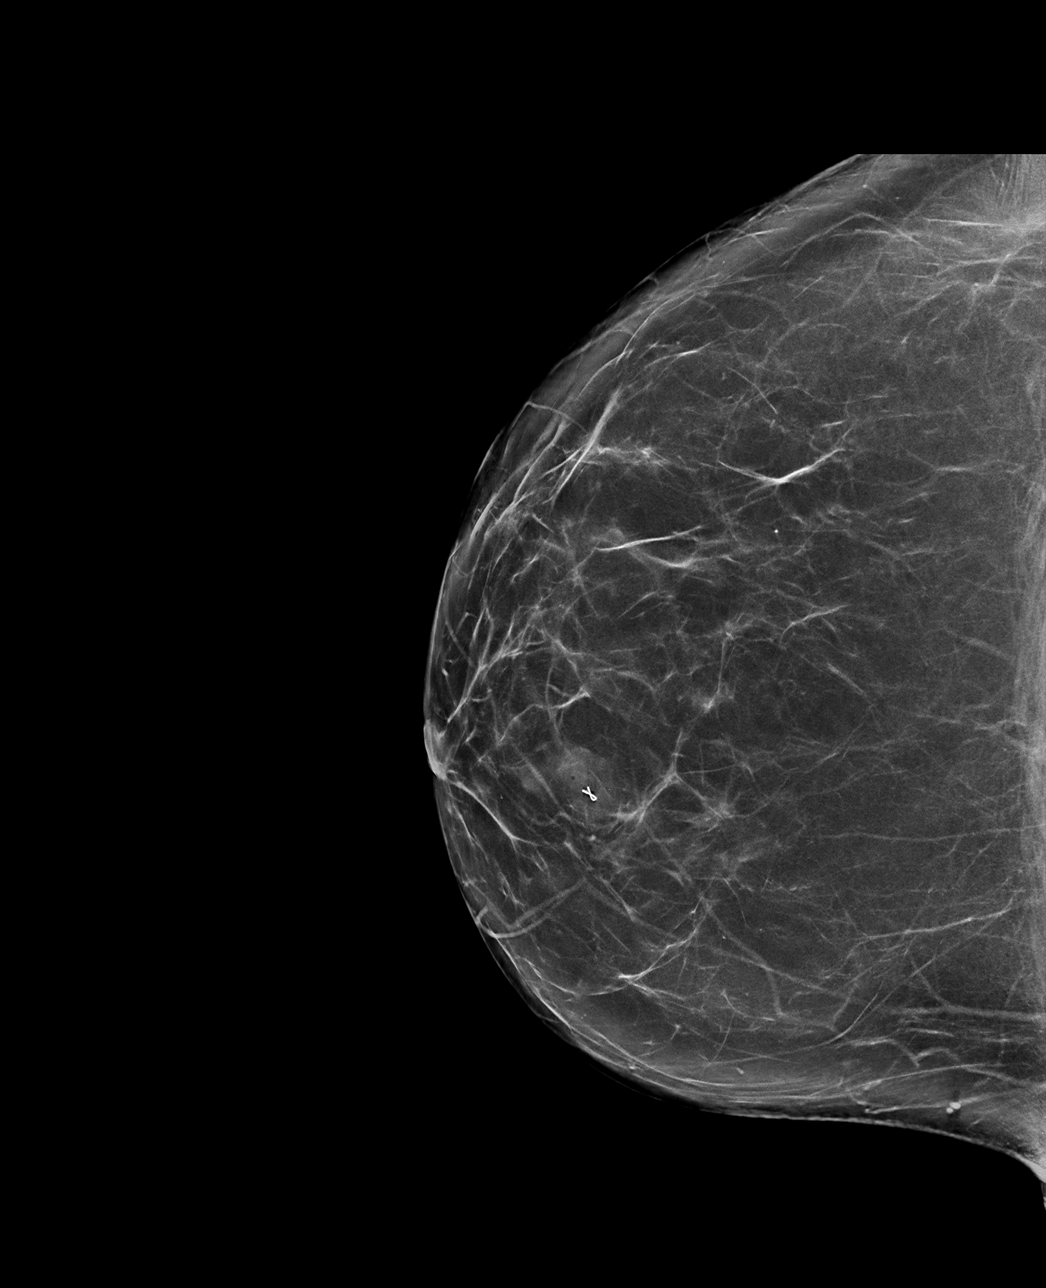

[R ML synth-2D]
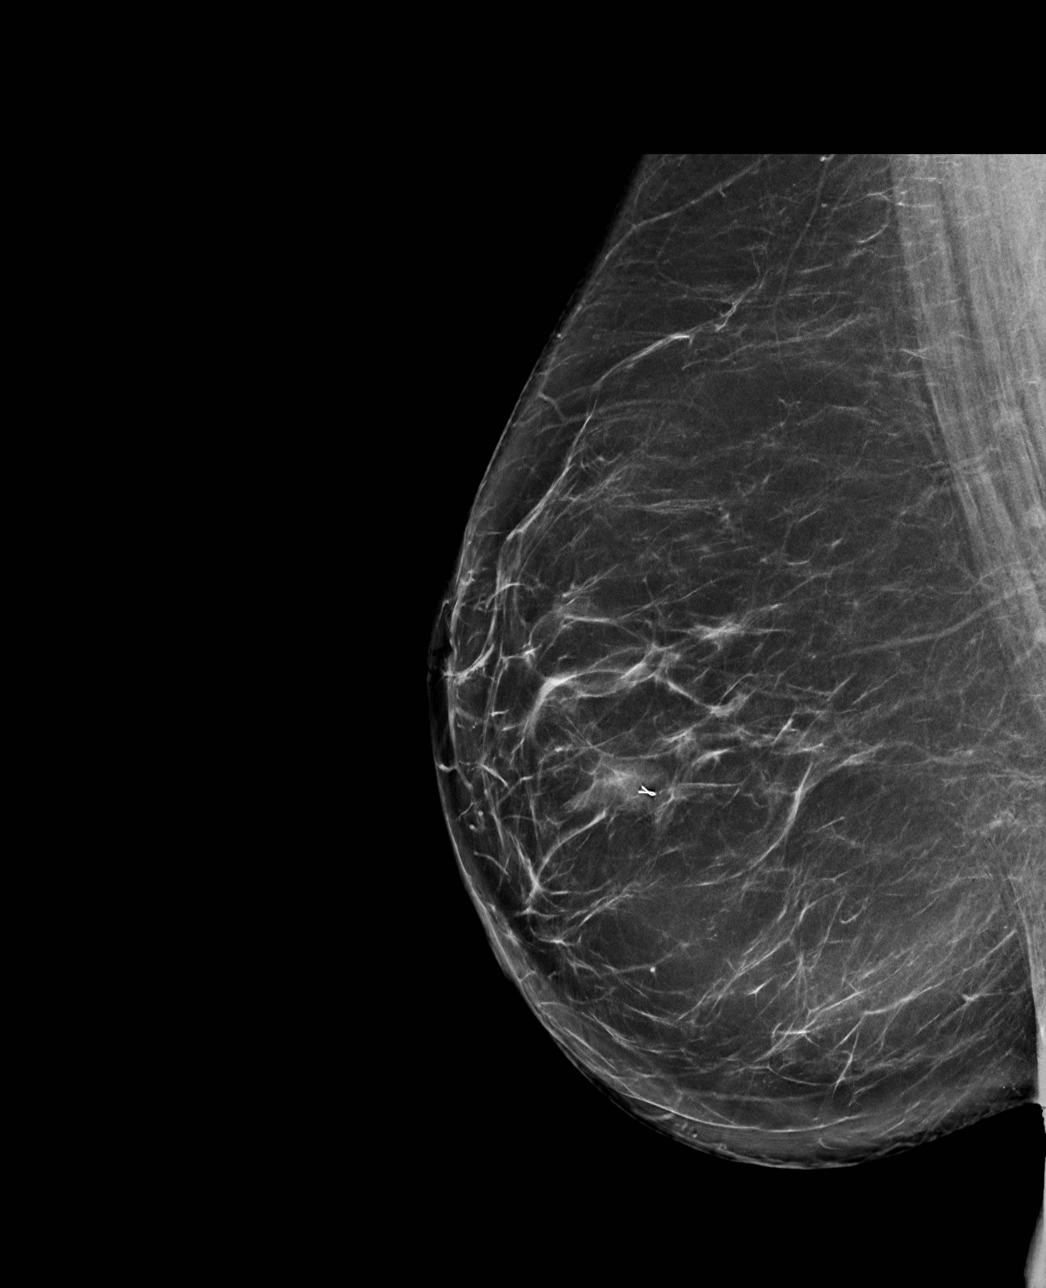

[R ML tomo · tomo slice 44/87.0]
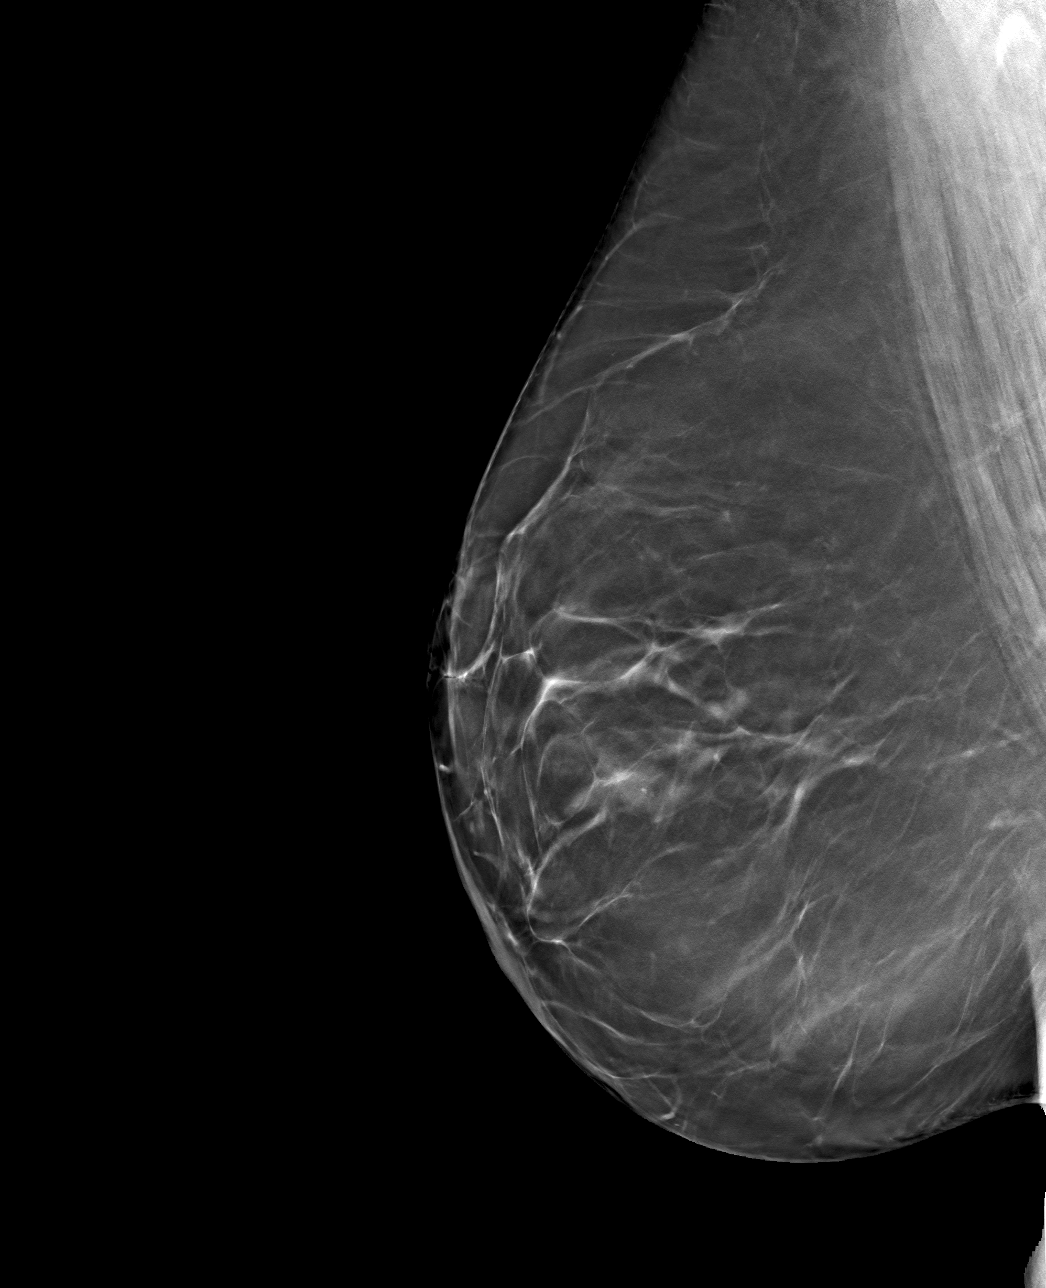

[R CC tomo · tomo slice 43/84.0]
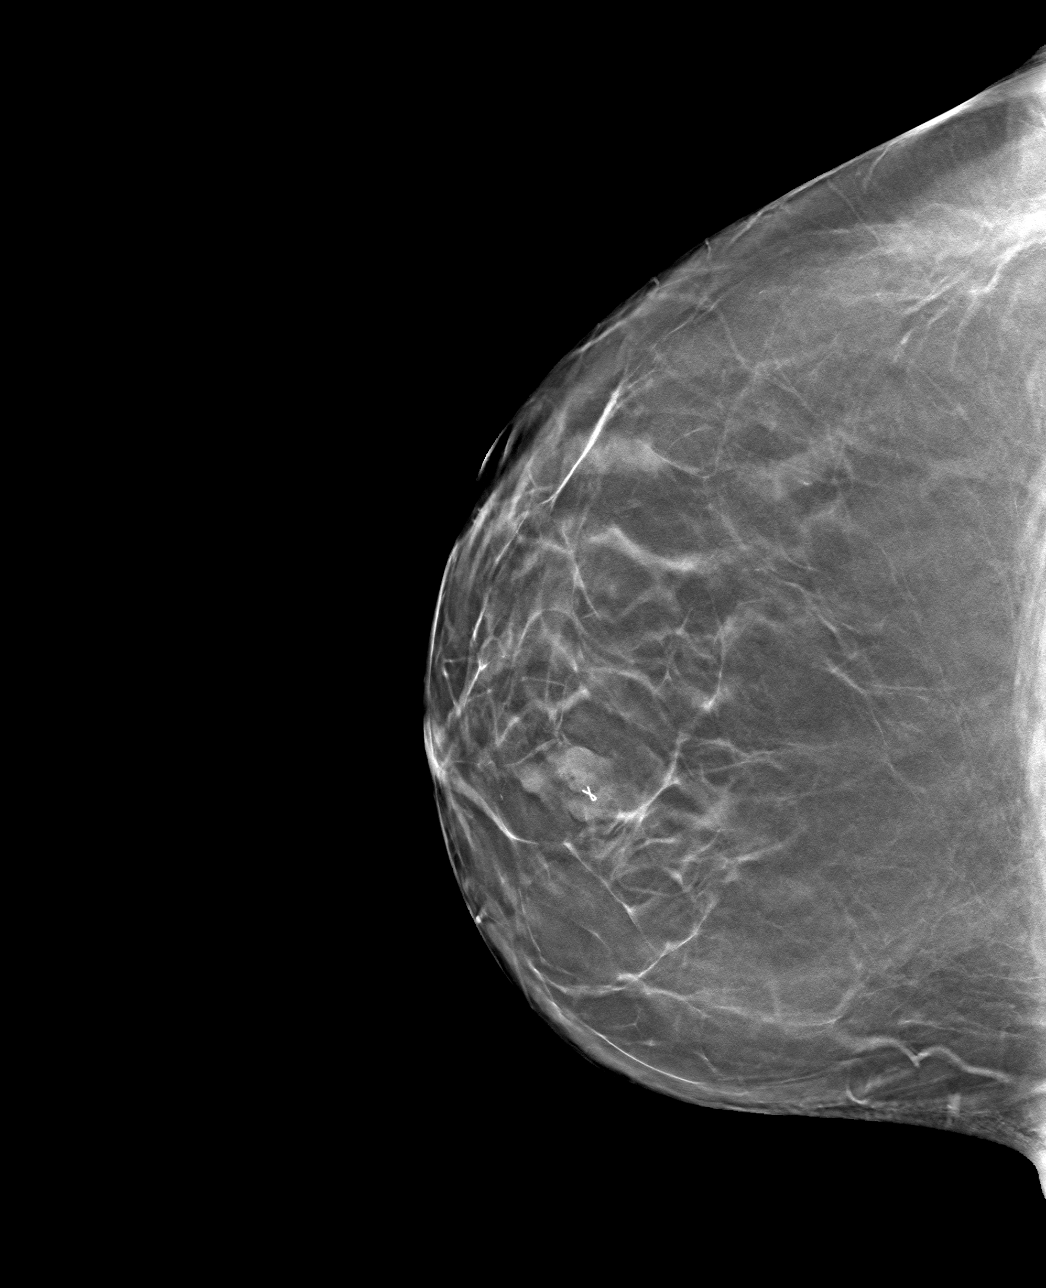

[4 of 12 positions shown; findings below may reference images not displayed]

FINDINGS: Mammographic images were obtained following ultrasound guided biopsy
of the 1.5 cm mass at the 12 o'clock position of the RIGHT breast.
The RIBBON biopsy marking clip is in expected position at the site
of biopsy.
IMPRESSION: Appropriate positioning of the RIBBON shaped biopsy marking clip at
the site of biopsy in the UPPER RIGHT breast.

Final Assessment: Post Procedure Mammograms for Marker Placement

## 2021-09-02 DIAGNOSIS — I10 Essential (primary) hypertension: Secondary | ICD-10-CM | POA: Diagnosis not present

## 2021-09-02 DIAGNOSIS — F330 Major depressive disorder, recurrent, mild: Secondary | ICD-10-CM | POA: Diagnosis not present

## 2021-09-02 DIAGNOSIS — R7301 Impaired fasting glucose: Secondary | ICD-10-CM | POA: Diagnosis not present

## 2021-09-02 DIAGNOSIS — E559 Vitamin D deficiency, unspecified: Secondary | ICD-10-CM | POA: Diagnosis not present

## 2021-09-02 DIAGNOSIS — E785 Hyperlipidemia, unspecified: Secondary | ICD-10-CM | POA: Diagnosis not present

## 2021-09-04 ENCOUNTER — Other Ambulatory Visit: Payer: Self-pay | Admitting: Internal Medicine

## 2021-09-04 DIAGNOSIS — Z1231 Encounter for screening mammogram for malignant neoplasm of breast: Secondary | ICD-10-CM

## 2021-09-25 ENCOUNTER — Ambulatory Visit
Admission: RE | Admit: 2021-09-25 | Discharge: 2021-09-25 | Disposition: A | Discharge status: Home / Self Care | Payer: BC Managed Care – PPO | Source: Ambulatory Visit | Attending: Internal Medicine | Admitting: Internal Medicine

## 2021-09-25 DIAGNOSIS — Z1231 Encounter for screening mammogram for malignant neoplasm of breast: Secondary | ICD-10-CM

## 2021-10-06 DIAGNOSIS — N200 Calculus of kidney: Secondary | ICD-10-CM | POA: Diagnosis not present

## 2021-10-06 DIAGNOSIS — N390 Urinary tract infection, site not specified: Secondary | ICD-10-CM | POA: Diagnosis not present

## 2021-10-06 DIAGNOSIS — R32 Unspecified urinary incontinence: Secondary | ICD-10-CM | POA: Diagnosis not present

## 2021-10-31 DIAGNOSIS — G4733 Obstructive sleep apnea (adult) (pediatric): Secondary | ICD-10-CM | POA: Diagnosis not present

## 2021-11-05 DIAGNOSIS — Z23 Encounter for immunization: Secondary | ICD-10-CM | POA: Diagnosis not present

## 2021-11-30 DIAGNOSIS — G4733 Obstructive sleep apnea (adult) (pediatric): Secondary | ICD-10-CM | POA: Diagnosis not present

## 2021-12-31 DIAGNOSIS — G4733 Obstructive sleep apnea (adult) (pediatric): Secondary | ICD-10-CM | POA: Diagnosis not present

## 2022-01-29 DIAGNOSIS — G4733 Obstructive sleep apnea (adult) (pediatric): Secondary | ICD-10-CM | POA: Diagnosis not present

## 2022-01-30 DIAGNOSIS — G4733 Obstructive sleep apnea (adult) (pediatric): Secondary | ICD-10-CM | POA: Diagnosis not present

## 2022-02-17 ENCOUNTER — Ambulatory Visit: Payer: BC Managed Care – PPO | Admitting: Primary Care

## 2022-02-22 ENCOUNTER — Encounter: Payer: Self-pay | Admitting: Primary Care

## 2022-02-22 ENCOUNTER — Ambulatory Visit: Payer: BC Managed Care – PPO | Admitting: Primary Care

## 2022-02-22 VITALS — BP 112/70 | HR 90 | Ht 65.0 in | Wt 238.0 lb

## 2022-02-22 DIAGNOSIS — G4733 Obstructive sleep apnea (adult) (pediatric): Secondary | ICD-10-CM

## 2022-02-22 NOTE — Assessment & Plan Note (Signed)
-  Moderate OSA. She is 97% compliant with CPAP use > 4 hours over the last 90 days.  Reports significant benefit in sleep and daytime fatigue with PAP therapy.  Pressure 5-15cm h20; Residual AHI 2.1/hour.  Advised patient continue to wear CPAP every night for minimum 4 to 6 hours.  Encourage side sleeping position and weight loss efforts.  Renew CPAP supplies with adapt.  Follow-up in 1 year or sooner if needed.

## 2022-02-22 NOTE — Progress Notes (Addendum)
$'@Patient'w$  ID: Margaret Mills, female    DOB: 12-Dec-1965, 57 y.o.   MRN: [id number]  Chief Complaint  Patient presents with   Follow-up    OSA  Breathing has improved in the last month.     Referring provider: Nicoletta Dress, MD  HPI: 57 year old female, never smoked. PMH significant for moderate OSA, HTN, hyperlipidemia, IBS, breast mass, covid-19, anxiety, GERD. Patient of Dr. Halford Chessman.   Previous LB encounter:  09/16/20- Dr. Halford Chessman, consult  She has snored for years.  She is a restless sleeper.  She had colonoscopy recently and had low oxygen level.  She was advised by anesthesiologist to get sleep testing.  She has trouble staying awake watching TV.  She gets frequent leg symptoms at night.  She grinds her teeth.  She wakes up hearing herself snore, and has been told she stops breathing at night.   She goes to sleep at 930 pm.  She falls asleep in 10 minutes.  She wakes up some times to use the bathroom.  She gets out of bed at 445 am.  She feels tired in the morning.  She denies morning headache.  She takes xanax at 830 pm to help with anxiety and sleep.  She drinks 3 cups of coffee during the day.   She denies sleep walking, sleep talking, or nightmares.  She denies sleep hallucinations, sleep paralysis, or cataplexy.   The Epworth score is 8 out of 24.  12/01/2020 Patient presents today to discuss sleep study results. She is often very tired. 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 HST on 11/20/20 showed moderate OSA, AHI with SpO2 low 83%. Reviewed sleep study results and discussed treatment options including weight loss, oral appliance, CPAP to referral to ENT.   02/17/2021 Patient presents today for 6 month follow-up moderate OSA. She was started on CPAP in fall 2022. She reports compliance with CPAP use. There were 5 days that her machine did not connect to airview, despite this she is 83% compliant with CPAP use > 4 hours. She is feeling significantly better since being started on  CPAP. She reports having more energy when she gets home from work. She has had some minor issues with airleaks with her full faced mask. Her weight did go up 7 lbs over the holidays and she is now following a keto diet to help with this.   Airview download 01/16/21- 02/14/21 Usage 25/30 days; 83% > 4 hours Average usage 7 hours 23 mins Pressure 5-15cm h20 (9.8cm h20-95%) Airleaks 9.2L/min (95%) AHI 2.3  02/22/2022- Interim hx  Patient presents today for annual follow-up sleep apnea. Patient has moderate OSA. She was started on CPAP in fall 2022. Current pressure auto settings 5-15cm h20. She is 100% compliant with CPAP use last 90 days. She is sleeping well. Reports improvement in fatigue. She changed jobs a month ago, no longer has an hour long commute. She is working 4.5 days a week. Quality of life is better. She has lost 10 lbs.   Airview download 11/21/21-02/18/22 Usage 90/90 days (100%); 87 days (97%) > 4 hours Average usage 8 hours 21 mins Pressure 5-15cm h20 Airleaks 4.6L/min AHI 2.1    Allergies  Allergen Reactions   Nsaids     Stomach cramping   Percocet [Oxycodone-Acetaminophen]     itching    Immunization History  Administered Date(s) Administered   Influenza Whole 10/23/2021   Tdap 02/08/2021    Past Medical History:  Diagnosis Date  Anxiety    Breast mass    Breast mass, right    Bursitis of left shoulder    Calcific tendinitis of left shoulder    COVID-19 virus infection 07/2020   Depression    Diabetes mellitus without complication (HCC)    GERD (gastroesophageal reflux disease)    Hyperlipidemia    Hypertension    IBS (irritable bowel syndrome)    Plantar fasciitis    Pre-diabetes    Vitamin D deficiency     Tobacco History: Social History   Tobacco Use  Smoking Status Never  Smokeless Tobacco Never   Counseling given: Not Answered   Outpatient Medications Prior to Visit  Medication Sig Dispense Refill   ALPRAZolam (XANAX) 0.25 MG tablet  Take 0.25 mg by mouth at bedtime as needed for anxiety.     lactobacillus acidophilus (BACID) TABS tablet Take 2 tablets by mouth. daily     losartan (COZAAR) 25 MG tablet Take 25 mg by mouth daily.     metoprolol succinate (TOPROL-XL) 100 MG 24 hr tablet Take 100 mg by mouth daily. Take with or immediately following a meal.     Omega-3 Fatty Acids (FISH OIL) 1000 MG CAPS Take 1,000 mg by mouth. daily     SUPER B COMPLEX/C PO Take by mouth.     valsartan (DIOVAN) 40 MG tablet Take 40 mg by mouth daily.     venlafaxine (EFFEXOR) 75 MG tablet Take 75 mg by mouth. daily     estrogen, conjugated,-medroxyprogesterone (PREMPRO) 0.3-1.5 MG tablet Take 1 tablet by mouth daily.     No facility-administered medications prior to visit.    Review of Systems  Review of Systems  Constitutional: Negative.  Negative for fatigue.  Respiratory: Negative.    Cardiovascular: Negative.    Physical Exam  BP 112/70 (BP Location: Left Arm, Patient Position: Sitting, Cuff Size: Large)   Pulse 90   Ht '5\' 5"'$  (1.651 m)   Wt 238 lb (108 kg)   SpO2 96%   BMI 39.61 kg/m  Physical Exam Constitutional:      Appearance: Normal appearance.  HENT:     Head: Normocephalic and atraumatic.     Mouth/Throat:     Mouth: Mucous membranes are moist.     Pharynx: Oropharynx is clear.  Cardiovascular:     Rate and Rhythm: Normal rate and regular rhythm.  Pulmonary:     Effort: Pulmonary effort is normal.     Breath sounds: Normal breath sounds.  Musculoskeletal:        General: Normal range of motion.  Skin:    General: Skin is warm and dry.  Neurological:     General: No focal deficit present.     Mental Status: She is alert and oriented to person, place, and time. Mental status is at baseline.  Psychiatric:        Mood and Affect: Mood normal.        Behavior: Behavior normal.        Thought Content: Thought content normal.        Judgment: Judgment normal.      Lab Results:  CBC No results found  for: "WBC", "RBC", "HGB", "HCT", "PLT", "MCV", "MCH", "MCHC", "RDW", "LYMPHSABS", "MONOABS", "EOSABS", "BASOSABS"  BMET    Component Value Date/Time   NA 139 09/16/2020 1054   K 4.2 09/16/2020 1054   CL 102 09/16/2020 1054   CO2 30 09/16/2020 1054   GLUCOSE 107 (H) 09/16/2020 1054   BUN 14  09/16/2020 1054   CREATININE 0.71 09/16/2020 1054   CALCIUM 9.4 09/16/2020 1054    BNP No results found for: "BNP"  ProBNP No results found for: "PROBNP"  Imaging: No results found.   Assessment & Plan:   Moderate obstructive sleep apnea - Moderate OSA. She is 97% compliant with CPAP use > 4 hours over the last 90 days.  Reports significant benefit in sleep and daytime fatigue with PAP therapy.  Pressure 5-15cm h20; Residual AHI 2.1/hour.  Advised patient continue to wear CPAP every night for minimum 4 to 6 hours.  Encourage side sleeping position and weight loss efforts.  Renew CPAP supplies with adapt.  Follow-up in 1 year or sooner if needed.   Martyn Ehrich, NP 02/22/2022

## 2022-02-22 NOTE — Patient Instructions (Addendum)
Excellent CPAP compliance, sleep apnea is well-controlled on current pressure settings  Recommendations: Continue to wear CPAP every night 4-6 hours or longer Do not drive experiencing excessive daytime sleepiness fatigue Continue weight loss efforts  Orders: Renew CPAP supplies with adapt/family medical   Follow-up: 1 year with Beth NP or sooner if needed

## 2022-02-24 NOTE — Progress Notes (Signed)
Reviewed and agree with assessment/plan.   Chesley Mires, MD [facility] Pulmonary/Critical Care 02/24/2022, 10:36 AM Pager:  [phone]

## 2022-03-01 DIAGNOSIS — G4733 Obstructive sleep apnea (adult) (pediatric): Secondary | ICD-10-CM | POA: Diagnosis not present

## 2022-03-09 DIAGNOSIS — R7301 Impaired fasting glucose: Secondary | ICD-10-CM | POA: Diagnosis not present

## 2022-03-09 DIAGNOSIS — E785 Hyperlipidemia, unspecified: Secondary | ICD-10-CM | POA: Diagnosis not present

## 2022-03-09 DIAGNOSIS — E559 Vitamin D deficiency, unspecified: Secondary | ICD-10-CM | POA: Diagnosis not present

## 2022-03-09 DIAGNOSIS — F330 Major depressive disorder, recurrent, mild: Secondary | ICD-10-CM | POA: Diagnosis not present

## 2022-03-09 DIAGNOSIS — I10 Essential (primary) hypertension: Secondary | ICD-10-CM | POA: Diagnosis not present

## 2022-04-01 DIAGNOSIS — G4733 Obstructive sleep apnea (adult) (pediatric): Secondary | ICD-10-CM | POA: Diagnosis not present

## 2022-04-29 DIAGNOSIS — G4733 Obstructive sleep apnea (adult) (pediatric): Secondary | ICD-10-CM | POA: Diagnosis not present

## 2022-05-01 DIAGNOSIS — G4733 Obstructive sleep apnea (adult) (pediatric): Secondary | ICD-10-CM | POA: Diagnosis not present

## 2022-05-10 DIAGNOSIS — M25512 Pain in left shoulder: Secondary | ICD-10-CM | POA: Diagnosis not present

## 2022-05-30 DIAGNOSIS — G4733 Obstructive sleep apnea (adult) (pediatric): Secondary | ICD-10-CM | POA: Diagnosis not present

## 2022-06-21 DIAGNOSIS — E785 Hyperlipidemia, unspecified: Secondary | ICD-10-CM | POA: Diagnosis not present

## 2022-06-21 DIAGNOSIS — F330 Major depressive disorder, recurrent, mild: Secondary | ICD-10-CM | POA: Diagnosis not present

## 2022-06-21 DIAGNOSIS — I10 Essential (primary) hypertension: Secondary | ICD-10-CM | POA: Diagnosis not present

## 2022-06-21 DIAGNOSIS — R7301 Impaired fasting glucose: Secondary | ICD-10-CM | POA: Diagnosis not present

## 2022-06-29 DIAGNOSIS — G4733 Obstructive sleep apnea (adult) (pediatric): Secondary | ICD-10-CM | POA: Diagnosis not present

## 2022-07-09 DIAGNOSIS — N952 Postmenopausal atrophic vaginitis: Secondary | ICD-10-CM | POA: Diagnosis not present

## 2022-07-28 DIAGNOSIS — G4733 Obstructive sleep apnea (adult) (pediatric): Secondary | ICD-10-CM | POA: Diagnosis not present

## 2022-08-01 DIAGNOSIS — G4733 Obstructive sleep apnea (adult) (pediatric): Secondary | ICD-10-CM | POA: Diagnosis not present

## 2022-08-27 DIAGNOSIS — G4733 Obstructive sleep apnea (adult) (pediatric): Secondary | ICD-10-CM | POA: Diagnosis not present

## 2022-09-06 ENCOUNTER — Other Ambulatory Visit: Payer: Self-pay | Admitting: Internal Medicine

## 2022-09-06 DIAGNOSIS — Z1231 Encounter for screening mammogram for malignant neoplasm of breast: Secondary | ICD-10-CM

## 2022-09-27 DIAGNOSIS — G4733 Obstructive sleep apnea (adult) (pediatric): Secondary | ICD-10-CM | POA: Diagnosis not present

## 2022-10-01 ENCOUNTER — Ambulatory Visit
Admission: RE | Admit: 2022-10-01 | Discharge: 2022-10-01 | Disposition: A | Discharge status: Home / Self Care | Payer: BC Managed Care – PPO | Source: Ambulatory Visit | Attending: Internal Medicine | Admitting: Internal Medicine

## 2022-10-01 DIAGNOSIS — Z1231 Encounter for screening mammogram for malignant neoplasm of breast: Secondary | ICD-10-CM | POA: Diagnosis not present

## 2022-10-27 DIAGNOSIS — G4733 Obstructive sleep apnea (adult) (pediatric): Secondary | ICD-10-CM | POA: Diagnosis not present

## 2022-11-01 DIAGNOSIS — G4733 Obstructive sleep apnea (adult) (pediatric): Secondary | ICD-10-CM | POA: Diagnosis not present

## 2022-11-11 DIAGNOSIS — L918 Other hypertrophic disorders of the skin: Secondary | ICD-10-CM | POA: Diagnosis not present

## 2022-11-26 DIAGNOSIS — G4733 Obstructive sleep apnea (adult) (pediatric): Secondary | ICD-10-CM | POA: Diagnosis not present

## 2022-12-06 DIAGNOSIS — H524 Presbyopia: Secondary | ICD-10-CM | POA: Diagnosis not present

## 2022-12-27 DIAGNOSIS — F330 Major depressive disorder, recurrent, mild: Secondary | ICD-10-CM | POA: Diagnosis not present

## 2022-12-27 DIAGNOSIS — R7301 Impaired fasting glucose: Secondary | ICD-10-CM | POA: Diagnosis not present

## 2022-12-27 DIAGNOSIS — G4733 Obstructive sleep apnea (adult) (pediatric): Secondary | ICD-10-CM | POA: Diagnosis not present

## 2022-12-27 DIAGNOSIS — E785 Hyperlipidemia, unspecified: Secondary | ICD-10-CM | POA: Diagnosis not present

## 2022-12-27 DIAGNOSIS — I10 Essential (primary) hypertension: Secondary | ICD-10-CM | POA: Diagnosis not present

## 2022-12-27 DIAGNOSIS — E559 Vitamin D deficiency, unspecified: Secondary | ICD-10-CM | POA: Diagnosis not present

## 2023-01-27 DIAGNOSIS — G4733 Obstructive sleep apnea (adult) (pediatric): Secondary | ICD-10-CM | POA: Diagnosis not present

## 2023-01-31 DIAGNOSIS — G4733 Obstructive sleep apnea (adult) (pediatric): Secondary | ICD-10-CM | POA: Diagnosis not present

## 2023-02-18 ENCOUNTER — Telehealth: Payer: BC Managed Care – PPO | Admitting: Primary Care

## 2023-02-18 DIAGNOSIS — G4733 Obstructive sleep apnea (adult) (pediatric): Secondary | ICD-10-CM | POA: Diagnosis not present

## 2023-02-18 NOTE — Progress Notes (Signed)
 Virtual Visit via Video Note  I connected with Margaret Mills on 02/18/23 at  3:00 PM EST by a video enabled telemedicine application and verified that I am speaking with the correct person using two identifiers.  Location: Patient: Home Provider: Office    I discussed the limitations of evaluation and management by telemedicine and the availability of in person appointments. The patient expressed understanding and agreed to proceed.  History of Present Illness: 58 year old female, never smoked. PMH significant for moderate OSA, HTN, hyperlipidemia, IBS, breast mass, covid-19, anxiety, GERD. Patient of Dr. Shellia.   Previous LB encounter:  09/16/20- Dr. Shellia, consult  She has snored for years.  She is a restless sleeper.  She had colonoscopy recently and had low oxygen level.  She was advised by anesthesiologist to get sleep testing.  She has trouble staying awake watching TV.  She gets frequent leg symptoms at night.  She grinds her teeth.  She wakes up hearing herself snore, and has been told she stops breathing at night.   She goes to sleep at 930 pm.  She falls asleep in 10 minutes.  She wakes up some times to use the bathroom.  She gets out of bed at 445 am.  She feels tired in the morning.  She denies morning headache.  She takes xanax at 830 pm to help with anxiety and sleep.  She drinks 3 cups of coffee during the day.   She denies sleep walking, sleep talking, or nightmares.  She denies sleep hallucinations, sleep paralysis, or cataplexy.   The Epworth score is 8 out of 24.  12/01/2020 Patient presents today to discuss sleep study results. She is often very tired. Works as patent examiner 6 days a week. HST on 11/20/20 showed moderate OSA, AHI 23 with SpO2 low 83%. Reviewed sleep study results and discussed treatment options including weight loss, oral appliance, CPAP to referral to ENT.   02/17/2021 Patient presents today for 6 month follow-up moderate OSA. She was started on CPAP in  fall 2022. She reports compliance with CPAP use. There were 5 days that her machine did not connect to airview, despite this she is 83% compliant with CPAP use > 4 hours. She is feeling significantly better since being started on CPAP. She reports having more energy when she gets home from work. She has had some minor issues with airleaks with her full faced mask. Her weight did go up 7 lbs over the holidays and she is now following a keto diet to help with this.   Airview download 01/16/21- 02/14/21 Usage 25/30 days; 83% > 4 hours Average usage 7 hours 23 mins Pressure 5-15cm h20 (9.8cm h20-95%) Airleaks 9.2L/min (95%) AHI 2.3  02/22/2022 Patient presents today for annual follow-up sleep apnea. Patient has moderate OSA. She was started on CPAP in fall 2022. Current pressure auto settings 5-15cm h20. She is 100% compliant with CPAP use last 90 days. She is sleeping well. Reports improvement in fatigue. She changed jobs a month ago, no longer has an hour long commute. She is working 4.5 days a week. Quality of life is better. She has lost 10 lbs.   Airview download 11/21/21-02/18/22 Usage 90/90 days (100%); 87 days (97%) > 4 hours Average usage 8 hours 21 mins Pressure 5-15cm h20 Airleaks 4.6L/min AHI 2.1    02/18/2023- Interim hx  Discussed the use of AI scribe software for clinical note transcription with the patient, who gave verbal consent to proceed.  History  of Present Illness   The patient, with a history of sleep apnea, was initiated on CPAP therapy in the fall of 2022. She has been using the device fairly consistently, with only a few days in the past month without use. The patient meets the compliance requirement of 70% usage for four hours or more, with an average nightly usage of six and a half hours. The device is set to auto-adjust pressures, and the patient's apnea score is three events per hour, indicating well-controlled sleep apnea.  However, the patient has experienced several  life events in the past six months that have impacted her CPAP compliance. Her mother, diagnosed with mild dementia, moved in with her, necessitating frequent nocturnal awakenings to assist her. Additionally, home repairs required a temporary relocation, making the transport and use of the CPAP device inconvenient.  The patient has also undergone significant weight loss, shedding 80 pounds over the past year with the aid of Zepbound. Despite hopes that this weight loss would alleviate her sleep apnea, the patient reports no significant change in her apnea events. She has also expressed concerns about the impact of the weight loss and the CPAP mask on her facial skin, noting increased looseness and visible marks from the mask. She has considered the need for a different mask due to these changes.      Airview download 01/17/2023 - 02/15/2023 Usage days 25/30 days (83%); 22 days (73%) Average usage 6 hours 30 minutes Pressure 5 to 15 cm H2O (7.6 cm H2O-95%) Air leaks 1.6 L/min (95%) AHI 3.3    Observations/Objective:  Appears well without overt respiratory symptoms   Assessment and Plan:  1. OSA (obstructive sleep apnea) (Primary) - Ambulatory Referral for DME     Obstructive Sleep Apnea Patient is 83% compliant with CPAP use, with an average of 6.5 hours per night. Apnea-Hypopnea Index (AHI) is 3 events per hour, indicating well-controlled sleep apnea. Patient has experienced recent life stressors that have impacted compliance. Patient has also experienced significant weight loss (from 244 lbs to 163 lbs) but does not wish to discontinue CPAP at this time. -Continue current CPAP settings, 5-15cm h20. -Order for refitting of CPAP mask due to significant weight loss. -Consider repeating sleep study if patient wishes to assess need for CPAP after weight loss stabilization. -Check-in in 1 year or sooner if patient wishes to reassess need for CPAP.  Weight Loss Patient has lost significant  weight (from 244 lbs to 163 lbs) over the past year with the aid of Zepbound -Congratulate patient on weight loss. -Encourage continued healthy lifestyle habits.  Caregiving Stress Patient is experiencing stress related to caregiving for mother with mild dementia. -Encourage patient to discuss stressors with primary care provider or consider seeking support from a mental health professional if needed.      Follow Up Instructions:   1 year with Beth NP or sooner if needed   I discussed the assessment and treatment plan with the patient. The patient was provided an opportunity to ask questions and all were answered. The patient agreed with the plan and demonstrated an understanding of the instructions.   The patient was advised to call back or seek an in-person evaluation if the symptoms worsen or if the condition fails to improve as anticipated.  I provided 22 minutes of non-face-to-face time during this encounter.   Almarie LELON Ferrari, NP

## 2023-02-27 DIAGNOSIS — G4733 Obstructive sleep apnea (adult) (pediatric): Secondary | ICD-10-CM | POA: Diagnosis not present

## 2023-02-28 ENCOUNTER — Ambulatory Visit: Payer: BC Managed Care – PPO | Admitting: Primary Care

## 2023-03-18 ENCOUNTER — Telehealth: Payer: BC Managed Care – PPO | Admitting: Primary Care

## 2023-03-24 DIAGNOSIS — K645 Perianal venous thrombosis: Secondary | ICD-10-CM | POA: Diagnosis not present

## 2023-03-30 DIAGNOSIS — G4733 Obstructive sleep apnea (adult) (pediatric): Secondary | ICD-10-CM | POA: Diagnosis not present

## 2023-04-27 DIAGNOSIS — G4733 Obstructive sleep apnea (adult) (pediatric): Secondary | ICD-10-CM | POA: Diagnosis not present

## 2023-05-01 DIAGNOSIS — G4733 Obstructive sleep apnea (adult) (pediatric): Secondary | ICD-10-CM | POA: Diagnosis not present

## 2023-05-28 DIAGNOSIS — G4733 Obstructive sleep apnea (adult) (pediatric): Secondary | ICD-10-CM | POA: Diagnosis not present

## 2023-06-27 DIAGNOSIS — G4733 Obstructive sleep apnea (adult) (pediatric): Secondary | ICD-10-CM | POA: Diagnosis not present

## 2023-07-01 DIAGNOSIS — R7301 Impaired fasting glucose: Secondary | ICD-10-CM | POA: Diagnosis not present

## 2023-07-01 DIAGNOSIS — E559 Vitamin D deficiency, unspecified: Secondary | ICD-10-CM | POA: Diagnosis not present

## 2023-07-01 DIAGNOSIS — E785 Hyperlipidemia, unspecified: Secondary | ICD-10-CM | POA: Diagnosis not present

## 2023-07-01 DIAGNOSIS — F330 Major depressive disorder, recurrent, mild: Secondary | ICD-10-CM | POA: Diagnosis not present

## 2023-07-01 DIAGNOSIS — I10 Essential (primary) hypertension: Secondary | ICD-10-CM | POA: Diagnosis not present

## 2023-07-26 DIAGNOSIS — G4733 Obstructive sleep apnea (adult) (pediatric): Secondary | ICD-10-CM | POA: Diagnosis not present

## 2023-08-01 DIAGNOSIS — G4733 Obstructive sleep apnea (adult) (pediatric): Secondary | ICD-10-CM | POA: Diagnosis not present

## 2023-08-25 DIAGNOSIS — G4733 Obstructive sleep apnea (adult) (pediatric): Secondary | ICD-10-CM | POA: Diagnosis not present

## 2023-09-25 DIAGNOSIS — G4733 Obstructive sleep apnea (adult) (pediatric): Secondary | ICD-10-CM | POA: Diagnosis not present

## 2023-10-11 ENCOUNTER — Telehealth: Payer: Self-pay | Admitting: Primary Care

## 2023-10-11 NOTE — Telephone Encounter (Signed)
 Copied from CRM [id number]. Topic: Appointments - Scheduling Inquiry for Clinic >> Oct 07, 2023  4:31 PM Margaret Mills wrote: Reason for CRM: Patient would like to schedule a follow-up for advice on getting off of CPAP since she has lost 100lbs. Patient is only available on Thursday/Friday after 12pm so if there is any possibility to get an appointment that would work. She isn't in any rush but future templates are not out yet for [name]'s schedule.   Attempted to call patient. Left voicemail for patient to call office back to get scheduled.

## 2023-11-03 DIAGNOSIS — F330 Major depressive disorder, recurrent, mild: Secondary | ICD-10-CM | POA: Diagnosis not present

## 2023-11-03 DIAGNOSIS — E785 Hyperlipidemia, unspecified: Secondary | ICD-10-CM | POA: Diagnosis not present

## 2023-11-03 DIAGNOSIS — I10 Essential (primary) hypertension: Secondary | ICD-10-CM | POA: Diagnosis not present

## 2023-11-03 DIAGNOSIS — E559 Vitamin D deficiency, unspecified: Secondary | ICD-10-CM | POA: Diagnosis not present

## 2023-11-04 DIAGNOSIS — G4733 Obstructive sleep apnea (adult) (pediatric): Secondary | ICD-10-CM | POA: Diagnosis not present

## 2023-11-23 ENCOUNTER — Other Ambulatory Visit: Payer: Self-pay | Admitting: Internal Medicine

## 2023-11-23 DIAGNOSIS — Z1231 Encounter for screening mammogram for malignant neoplasm of breast: Secondary | ICD-10-CM

## 2023-12-02 ENCOUNTER — Encounter: Payer: Self-pay | Admitting: Primary Care

## 2023-12-02 ENCOUNTER — Ambulatory Visit: Admitting: Primary Care

## 2023-12-02 VITALS — BP 106/72 | HR 78 | Temp 98.3°F | Ht 64.0 in | Wt 145.0 lb

## 2023-12-02 DIAGNOSIS — G4733 Obstructive sleep apnea (adult) (pediatric): Secondary | ICD-10-CM | POA: Diagnosis not present

## 2023-12-02 NOTE — Progress Notes (Signed)
 @Patient  ID: Margaret Mills, female    DOB: 10-23-1965, 58 y.o.   MRN: [id number]  No chief complaint on file.   Referring provider: Keren Vicenta BRAVO, MD  HPI: 58 year old female, never smoked. PMH significant for moderate OSA, HTN, hyperlipidemia, IBS, breast mass, covid-19, anxiety, GERD. Patient of Dr. Shellia.   Previous [facility] encounter:  09/16/20- Dr. Shellia, consult  She has snored for years.  She is a restless sleeper.  She had colonoscopy recently and had low oxygen level.  She was advised by anesthesiologist to get sleep testing.  She has trouble staying awake watching TV.  She gets frequent leg symptoms at night.  She grinds her teeth.  She wakes up hearing herself snore, and has been told she stops breathing at night.   She goes to sleep at 930 pm.  She falls asleep in 10 minutes.  She wakes up some times to use the bathroom.  She gets out of bed at 445 am.  She feels tired in the morning.  She denies morning headache.  She takes xanax at 830 pm to help with anxiety and sleep.  She drinks 3 cups of coffee during the day.   She denies sleep walking, sleep talking, or nightmares.  She denies sleep hallucinations, sleep paralysis, or cataplexy.   The Epworth score is 8 out of 24.  12/02/2023 - Interim hx  Discussed the use of AI scribe software for clinical note transcription with the patient, who gave verbal consent to proceed. History of Present Illness Margaret Mills is a 58 year old female with moderate obstructive sleep apnea who presents for evaluation of CPAP use and consideration of discontinuation due to significant weight loss.  She has a history of moderate obstructive sleep apnea, diagnosed via a home sleep study on November 20, 2020, with an AHI of 23.2. At that time, her weight was 224 pounds. She has been using a CPAP machine since the diagnosis but is currently experiencing issues with the CPAP mask, describing it as 'ruined'.  Over the past two-three years, she has lost  approximately 100 pounds, reducing her weight to 145 pounds, aided by the medication Zepbound, which she takes at a dose of 15 mg. She tolerates the medication well and has not faced any insurance-related difficulties. Her previous weight at the last visit was 163 pounds.  She no longer wakes up gasping and feels like she is sleeping okay. She does not have a bed partner to report on her snoring due to her current living situation, where she stays in a separate part of the house to care for her mother.  She is frustrated with the CPAP, stating it interferes with her quality of life, especially as she is up frequently at night caring for her mother and dogs. She sometimes forgets to turn the CPAP machine on after reconnecting it, leading to concerns about breathing 'bad air'.  No underlying lung conditions such as asthma or COPD, and she does not use supplemental oxygen. No heart conditions. Her previous sleep study indicated oxygen desaturation to 70%.  Current CPAP data shows she is still having some obstructive events, approximately four per hour. However, she has never had a night without events.      Allergies  Allergen Reactions   Nsaids     Stomach cramping   Percocet [Oxycodone-Acetaminophen ]     itching    Immunization History  Administered Date(s) Administered   Influenza Whole 10/23/2021   Tdap 02/08/2021  Past Medical History:  Diagnosis Date   Anxiety    Breast mass    Breast mass, right    Bursitis of left shoulder    Calcific tendinitis of left shoulder    COVID-19 virus infection 07/2020   Depression    Diabetes mellitus without complication (HCC)    GERD (gastroesophageal reflux disease)    Hyperlipidemia    Hypertension    IBS (irritable bowel syndrome)    Plantar fasciitis    Pre-diabetes    Vitamin D deficiency     Tobacco History: Social History   Tobacco Use  Smoking Status Never  Smokeless Tobacco Never   Counseling given: Not  Answered   Outpatient Medications Prior to Visit  Medication Sig Dispense Refill   ALPRAZolam (XANAX) 0.25 MG tablet Take 0.25 mg by mouth at bedtime as needed for anxiety.     lactobacillus acidophilus (BACID) TABS tablet Take 2 tablets by mouth. daily     losartan (COZAAR) 25 MG tablet Take 25 mg by mouth daily.     metoprolol succinate (TOPROL-XL) 100 MG 24 hr tablet Take 100 mg by mouth daily. Take with or immediately following a meal.     Omega-3 Fatty Acids (FISH OIL) 1000 MG CAPS Take 1,000 mg by mouth. daily     SUPER B COMPLEX/C PO Take by mouth.     valsartan (DIOVAN) 40 MG tablet Take 40 mg by mouth daily.     venlafaxine (EFFEXOR) 75 MG tablet Take 75 mg by mouth. daily     No facility-administered medications prior to visit.      Review of Systems  Review of Systems  Constitutional: Negative.   HENT: Negative.    Respiratory: Negative.    Cardiovascular: Negative.      Physical Exam  There were no vitals taken for this visit. Physical Exam Constitutional:      Appearance: Normal appearance. She is well-developed.  HENT:     Head: Normocephalic and atraumatic.     Mouth/Throat:     Mouth: Mucous membranes are moist.     Pharynx: Oropharynx is clear.  Eyes:     Pupils: Pupils are equal, round, and reactive to light.  Cardiovascular:     Rate and Rhythm: Normal rate and regular rhythm.     Heart sounds: Normal heart sounds. No murmur heard. Pulmonary:     Effort: Pulmonary effort is normal. No respiratory distress.     Breath sounds: Normal breath sounds. No wheezing or rhonchi.  Musculoskeletal:        General: Normal range of motion.     Cervical back: Normal range of motion and neck supple.  Skin:    General: Skin is warm and dry.     Findings: No erythema or rash.  Neurological:     General: No focal deficit present.     Mental Status: She is alert and oriented to person, place, and time. Mental status is at baseline.  Psychiatric:        Mood  and Affect: Mood normal.        Behavior: Behavior normal.        Thought Content: Thought content normal.        Judgment: Judgment normal.     Lab Results:  CBC No results found for: WBC, RBC, HGB, HCT, PLT, MCV, MCH, MCHC, RDW, LYMPHSABS, MONOABS, EOSABS, BASOSABS  BMET    Component Value Date/Time   NA 139 09/16/2020 1054   K 4.2 09/16/2020 1054  CL 102 09/16/2020 1054   CO2 30 09/16/2020 1054   GLUCOSE 107 (H) 09/16/2020 1054   BUN 14 09/16/2020 1054   CREATININE 0.71 09/16/2020 1054   CALCIUM 9.4 09/16/2020 1054    BNP No results found for: BNP  ProBNP No results found for: PROBNP  Imaging: No results found.   Assessment & Plan:   1. Moderate obstructive sleep apnea (Primary)  Assessment and Plan Assessment & Plan Obstructive sleep apnea Moderate obstructive sleep apnea diagnosed in October 2022 with an AHI of 23.2. Significant weight loss of 104 pounds since the last sleep study. Currently using CPAP with well-controlled events at 4 per hour. Reports waking up gasping and choking without CPAP. Considering Inspire therapy if moderate sleep apnea persists, but aware of potential disqualification due to improved condition from weight loss. Concerns about Inspire due to history of breast biopsies and potential interference with MRIs. A repeat sleep study is necessary to assess current severity and eligibility for Inspire. - Order home sleep study to reassess sleep apnea severity due to weight loss  - Instruct her not to use CPAP during the sleep study - Provide [facility] information for home sleep study coordination  Obesity Obesity with significant weight loss of 104 pounds aided by Zepbound. Current weight is 145 pounds. No issues tolerating Zepbound at a dose of 15 mg. Insurance coverage for Zepbound remains intact despite changes in [employer] and [employer]. - Continue Zepbound at 15 mg    Almarie LELON Ferrari,  NP 12/02/2023

## 2023-12-02 NOTE — Patient Instructions (Signed)
  VISIT SUMMARY: Today, we discussed your history of moderate obstructive sleep apnea and your significant weight loss over the past year. You have been using a CPAP machine but are considering discontinuation due to the weight loss and issues with the CPAP mask. We also reviewed your current use of Zepbound for weight management.  YOUR PLAN: -OBSTRUCTIVE SLEEP APNEA: Obstructive sleep apnea is a condition where your airway becomes blocked during sleep, causing breathing interruptions. You were diagnosed with moderate obstructive sleep apnea in October 2022. Given your significant weight loss, we need to reassess the severity of your sleep apnea. We will conduct a home sleep study to determine if you still need the CPAP machine or if other treatments like Inspire therapy are appropriate. Please do not use your CPAP during the sleep study. We will provide you with information on how to coordinate the home sleep study with [facility].  -OBESITY: Obesity is a condition characterized by excessive body fat. You have successfully lost 104 pounds with the help of the medication Zepbound, which you tolerate well. Your current weight is 145 pounds. Continue taking Zepbound at a dose of 15 mg as it is working well for you.  INSTRUCTIONS: Please follow up with the home sleep study as instructed. Do not use your CPAP machine during the study. We will provide you with the necessary information to coordinate this with [facility].  Orders: HST through [facility]  Follow-up Please call or send me a mychart message 2-3 weeks after completing sleep study for results and next steps

## 2023-12-04 DIAGNOSIS — G4733 Obstructive sleep apnea (adult) (pediatric): Secondary | ICD-10-CM | POA: Diagnosis not present

## 2023-12-12 DIAGNOSIS — H524 Presbyopia: Secondary | ICD-10-CM | POA: Diagnosis not present

## 2023-12-16 ENCOUNTER — Ambulatory Visit
Admission: RE | Admit: 2023-12-16 | Discharge: 2023-12-16 | Disposition: A | Discharge status: Home / Self Care | Source: Ambulatory Visit | Attending: Internal Medicine | Admitting: Internal Medicine

## 2023-12-16 DIAGNOSIS — Z1231 Encounter for screening mammogram for malignant neoplasm of breast: Secondary | ICD-10-CM | POA: Diagnosis not present

## 2024-01-04 DIAGNOSIS — G4733 Obstructive sleep apnea (adult) (pediatric): Secondary | ICD-10-CM | POA: Diagnosis not present

## 2024-01-20 DIAGNOSIS — F330 Major depressive disorder, recurrent, mild: Secondary | ICD-10-CM | POA: Diagnosis not present

## 2024-01-20 DIAGNOSIS — R7301 Impaired fasting glucose: Secondary | ICD-10-CM | POA: Diagnosis not present

## 2024-01-20 DIAGNOSIS — E559 Vitamin D deficiency, unspecified: Secondary | ICD-10-CM | POA: Diagnosis not present

## 2024-01-20 DIAGNOSIS — E785 Hyperlipidemia, unspecified: Secondary | ICD-10-CM | POA: Diagnosis not present

## 2024-01-20 DIAGNOSIS — I10 Essential (primary) hypertension: Secondary | ICD-10-CM | POA: Diagnosis not present

## 2024-03-22 ENCOUNTER — Institutional Professional Consult (permissible substitution): Payer: Self-pay

## 2024-03-30 ENCOUNTER — Institutional Professional Consult (permissible substitution): Payer: Self-pay
